# Patient Record
Sex: Male | Born: 1950 | Race: White | Hispanic: No | Marital: Single | Smoking: Current every day smoker
Health system: Southern US, Community
[De-identification: ages and names within clinical notes are randomized; demographics above are authoritative.]

## PROBLEM LIST (undated history)

## (undated) DIAGNOSIS — K219 Gastro-esophageal reflux disease without esophagitis: Secondary | ICD-10-CM

## (undated) DIAGNOSIS — I2510 Atherosclerotic heart disease of native coronary artery without angina pectoris: Secondary | ICD-10-CM

## (undated) DIAGNOSIS — E7800 Pure hypercholesterolemia, unspecified: Secondary | ICD-10-CM

## (undated) DIAGNOSIS — E119 Type 2 diabetes mellitus without complications: Secondary | ICD-10-CM

## (undated) DIAGNOSIS — I10 Essential (primary) hypertension: Secondary | ICD-10-CM

## (undated) DIAGNOSIS — I639 Cerebral infarction, unspecified: Secondary | ICD-10-CM

## (undated) DIAGNOSIS — T300 Burn of unspecified body region, unspecified degree: Secondary | ICD-10-CM

## (undated) HISTORY — PX: CORONARY ANGIOPLASTY: SHX604

---

## 2012-08-05 DIAGNOSIS — T300 Burn of unspecified body region, unspecified degree: Secondary | ICD-10-CM

## 2012-08-05 HISTORY — DX: Burn of unspecified body region, unspecified degree: T30.0

## 2013-11-25 ENCOUNTER — Emergency Department (HOSPITAL_COMMUNITY): Payer: Medicare Other

## 2013-11-25 ENCOUNTER — Inpatient Hospital Stay (HOSPITAL_COMMUNITY): Payer: Medicare Other

## 2013-11-25 ENCOUNTER — Encounter (HOSPITAL_COMMUNITY): Payer: Self-pay | Admitting: Emergency Medicine

## 2013-11-25 ENCOUNTER — Inpatient Hospital Stay (HOSPITAL_COMMUNITY)
Admission: EM | Admit: 2013-11-25 | Discharge: 2013-12-07 | DRG: 958 | Disposition: A | Discharge status: Skilled Nursing Facility | Payer: Medicare Other | Attending: General Surgery | Admitting: General Surgery

## 2013-11-25 DIAGNOSIS — S82209A Unspecified fracture of shaft of unspecified tibia, initial encounter for closed fracture: Secondary | ICD-10-CM | POA: Diagnosis present

## 2013-11-25 DIAGNOSIS — S32409A Unspecified fracture of unspecified acetabulum, initial encounter for closed fracture: Principal | ICD-10-CM | POA: Diagnosis present

## 2013-11-25 DIAGNOSIS — IMO0002 Reserved for concepts with insufficient information to code with codable children: Secondary | ICD-10-CM | POA: Diagnosis present

## 2013-11-25 DIAGNOSIS — J9819 Other pulmonary collapse: Secondary | ICD-10-CM | POA: Diagnosis present

## 2013-11-25 DIAGNOSIS — T792XXA Traumatic secondary and recurrent hemorrhage and seroma, initial encounter: Secondary | ICD-10-CM | POA: Diagnosis present

## 2013-11-25 DIAGNOSIS — S82891A Other fracture of right lower leg, initial encounter for closed fracture: Secondary | ICD-10-CM | POA: Diagnosis present

## 2013-11-25 DIAGNOSIS — S52599A Other fractures of lower end of unspecified radius, initial encounter for closed fracture: Secondary | ICD-10-CM | POA: Diagnosis present

## 2013-11-25 DIAGNOSIS — E785 Hyperlipidemia, unspecified: Secondary | ICD-10-CM | POA: Diagnosis present

## 2013-11-25 DIAGNOSIS — I10 Essential (primary) hypertension: Secondary | ICD-10-CM | POA: Diagnosis present

## 2013-11-25 DIAGNOSIS — S2220XA Unspecified fracture of sternum, initial encounter for closed fracture: Secondary | ICD-10-CM

## 2013-11-25 DIAGNOSIS — S82109A Unspecified fracture of upper end of unspecified tibia, initial encounter for closed fracture: Secondary | ICD-10-CM | POA: Diagnosis present

## 2013-11-25 DIAGNOSIS — Z8673 Personal history of transient ischemic attack (TIA), and cerebral infarction without residual deficits: Secondary | ICD-10-CM

## 2013-11-25 DIAGNOSIS — S82142A Displaced bicondylar fracture of left tibia, initial encounter for closed fracture: Secondary | ICD-10-CM | POA: Diagnosis present

## 2013-11-25 DIAGNOSIS — S8253XA Displaced fracture of medial malleolus of unspecified tibia, initial encounter for closed fracture: Secondary | ICD-10-CM | POA: Diagnosis present

## 2013-11-25 DIAGNOSIS — R509 Fever, unspecified: Secondary | ICD-10-CM | POA: Diagnosis not present

## 2013-11-25 DIAGNOSIS — E7800 Pure hypercholesterolemia, unspecified: Secondary | ICD-10-CM | POA: Diagnosis present

## 2013-11-25 DIAGNOSIS — E119 Type 2 diabetes mellitus without complications: Secondary | ICD-10-CM | POA: Diagnosis present

## 2013-11-25 DIAGNOSIS — S6990XA Unspecified injury of unspecified wrist, hand and finger(s), initial encounter: Secondary | ICD-10-CM | POA: Diagnosis present

## 2013-11-25 DIAGNOSIS — J811 Chronic pulmonary edema: Secondary | ICD-10-CM | POA: Diagnosis present

## 2013-11-25 DIAGNOSIS — S93409A Sprain of unspecified ligament of unspecified ankle, initial encounter: Secondary | ICD-10-CM | POA: Diagnosis present

## 2013-11-25 DIAGNOSIS — S27329A Contusion of lung, unspecified, initial encounter: Secondary | ICD-10-CM

## 2013-11-25 DIAGNOSIS — I2510 Atherosclerotic heart disease of native coronary artery without angina pectoris: Secondary | ICD-10-CM | POA: Diagnosis present

## 2013-11-25 DIAGNOSIS — S62109A Fracture of unspecified carpal bone, unspecified wrist, initial encounter for closed fracture: Secondary | ICD-10-CM | POA: Diagnosis present

## 2013-11-25 DIAGNOSIS — K560 Paralytic ileus: Secondary | ICD-10-CM | POA: Diagnosis present

## 2013-11-25 DIAGNOSIS — E875 Hyperkalemia: Secondary | ICD-10-CM | POA: Diagnosis not present

## 2013-11-25 DIAGNOSIS — S301XXA Contusion of abdominal wall, initial encounter: Secondary | ICD-10-CM | POA: Diagnosis present

## 2013-11-25 DIAGNOSIS — S62101A Fracture of unspecified carpal bone, right wrist, initial encounter for closed fracture: Secondary | ICD-10-CM | POA: Diagnosis present

## 2013-11-25 DIAGNOSIS — D62 Acute posthemorrhagic anemia: Secondary | ICD-10-CM | POA: Diagnosis present

## 2013-11-25 DIAGNOSIS — G8929 Other chronic pain: Secondary | ICD-10-CM | POA: Diagnosis present

## 2013-11-25 DIAGNOSIS — F17200 Nicotine dependence, unspecified, uncomplicated: Secondary | ICD-10-CM | POA: Diagnosis present

## 2013-11-25 DIAGNOSIS — S32402A Unspecified fracture of left acetabulum, initial encounter for closed fracture: Secondary | ICD-10-CM | POA: Diagnosis present

## 2013-11-25 HISTORY — DX: Burn of unspecified body region, unspecified degree: T30.0

## 2013-11-25 HISTORY — DX: Type 2 diabetes mellitus without complications: E11.9

## 2013-11-25 HISTORY — DX: Atherosclerotic heart disease of native coronary artery without angina pectoris: I25.10

## 2013-11-25 HISTORY — PX: ORIF DISTAL RADIUS FRACTURE: SUR927

## 2013-11-25 HISTORY — DX: Cerebral infarction, unspecified: I63.9

## 2013-11-25 HISTORY — DX: Pure hypercholesterolemia, unspecified: E78.00

## 2013-11-25 HISTORY — DX: Essential (primary) hypertension: I10

## 2013-11-25 HISTORY — DX: Gastro-esophageal reflux disease without esophagitis: K21.9

## 2013-11-25 LAB — CBC
HCT: 39.4 % (ref 39.0–52.0)
Hemoglobin: 12.5 g/dL — ABNORMAL LOW (ref 13.0–17.0)
MCH: 26 pg (ref 26.0–34.0)
MCHC: 31.7 g/dL (ref 30.0–36.0)
MCV: 81.9 fL (ref 78.0–100.0)
PLATELETS: 391 10*3/uL (ref 150–400)
RBC: 4.81 MIL/uL (ref 4.22–5.81)
RDW: 17.8 % — ABNORMAL HIGH (ref 11.5–15.5)
WBC: 35.3 10*3/uL — AB (ref 4.0–10.5)

## 2013-11-25 LAB — I-STAT CG4 LACTIC ACID, ED: Lactic Acid, Venous: 4.43 mmol/L — ABNORMAL HIGH (ref 0.5–2.2)

## 2013-11-25 LAB — COMPREHENSIVE METABOLIC PANEL
ALT: 33 U/L (ref 0–53)
AST: 36 U/L (ref 0–37)
Albumin: 3.4 g/dL — ABNORMAL LOW (ref 3.5–5.2)
Alkaline Phosphatase: 130 U/L — ABNORMAL HIGH (ref 39–117)
BILIRUBIN TOTAL: 0.2 mg/dL — AB (ref 0.3–1.2)
BUN: 15 mg/dL (ref 6–23)
CHLORIDE: 98 meq/L (ref 96–112)
CO2: 23 meq/L (ref 19–32)
CREATININE: 1 mg/dL (ref 0.50–1.35)
Calcium: 9 mg/dL (ref 8.4–10.5)
GFR calc Af Amer: >90 mL/min (ref >90)
GFR, EST NON AFRICAN AMERICAN: 78 mL/min — AB (ref >90)
Glucose, Bld: 244 mg/dL — ABNORMAL HIGH (ref 70–99)
Potassium: 4.3 mEq/L (ref 3.7–5.3)
Sodium: 136 mEq/L — ABNORMAL LOW (ref 137–147)
Total Protein: 6.7 g/dL (ref 6.0–8.3)

## 2013-11-25 LAB — ETHANOL: Alcohol, Ethyl (B): <11 mg/dL (ref 0–11)

## 2013-11-25 LAB — SAMPLE TO BLOOD BANK

## 2013-11-25 LAB — PROTIME-INR
INR: 0.96 (ref 0.00–1.49)
Prothrombin Time: 12.6 seconds (ref 11.6–15.2)

## 2013-11-25 LAB — GLUCOSE, CAPILLARY: Glucose-Capillary: 199 mg/dL — ABNORMAL HIGH (ref 70–99)

## 2013-11-25 MED ORDER — HYDROMORPHONE HCL PF 1 MG/ML IJ SOLN
1.0000 mg | Freq: Once | INTRAMUSCULAR | Status: AC
  Administered 2013-11-25: 1 mg via INTRAVENOUS
  Filled 2013-11-25: qty 1

## 2013-11-25 MED ORDER — IOHEXOL 350 MG/ML SOLN
100.0000 mL | Freq: Once | INTRAVENOUS | Status: AC | PRN
  Administered 2013-11-25: 100 mL via INTRAVENOUS

## 2013-11-25 MED ORDER — METOPROLOL TARTRATE 25 MG PO TABS
25.0000 mg | ORAL_TABLET | Freq: Two times a day (BID) | ORAL | Status: DC
  Administered 2013-11-25 – 2013-11-29 (×8): 25 mg via ORAL
  Filled 2013-11-25 (×9): qty 1

## 2013-11-25 MED ORDER — DIPHENHYDRAMINE HCL 50 MG/ML IJ SOLN
12.5000 mg | Freq: Four times a day (QID) | INTRAMUSCULAR | Status: DC | PRN
Start: 2013-11-25 — End: 2013-11-26

## 2013-11-25 MED ORDER — DOCUSATE SODIUM 100 MG PO CAPS
100.0000 mg | ORAL_CAPSULE | Freq: Two times a day (BID) | ORAL | Status: DC
  Administered 2013-11-25 – 2013-11-28 (×6): 100 mg via ORAL
  Filled 2013-11-25 (×9): qty 1

## 2013-11-25 MED ORDER — HYDROMORPHONE HCL PF 1 MG/ML IJ SOLN
1.0000 mg | Freq: Once | INTRAMUSCULAR | Status: AC
  Administered 2013-11-25: 1 mg via INTRAVENOUS

## 2013-11-25 MED ORDER — SODIUM CHLORIDE 0.9 % IJ SOLN: 9.0000 mL | INTRAMUSCULAR | Status: DC | PRN

## 2013-11-25 MED ORDER — BISACODYL 10 MG RE SUPP: 10.0000 mg | Freq: Every day | RECTAL | Status: DC | PRN

## 2013-11-25 MED ORDER — ONDANSETRON HCL 4 MG/2ML IJ SOLN
4.0000 mg | Freq: Once | INTRAMUSCULAR | Status: AC
  Administered 2013-11-25: 4 mg via INTRAVENOUS
  Filled 2013-11-25: qty 2

## 2013-11-25 MED ORDER — HYDROMORPHONE HCL PF 1 MG/ML IJ SOLN
0.5000 mg | Freq: Once | INTRAMUSCULAR | Status: AC
  Administered 2013-11-25: 0.5 mg via INTRAVENOUS
  Filled 2013-11-25: qty 1

## 2013-11-25 MED ORDER — ENALAPRIL MALEATE 20 MG PO TABS
20.0000 mg | ORAL_TABLET | Freq: Every day | ORAL | Status: DC
  Administered 2013-11-26 – 2013-11-28 (×3): 20 mg via ORAL
  Filled 2013-11-25 (×4): qty 1

## 2013-11-25 MED ORDER — HYDROMORPHONE HCL PF 1 MG/ML IJ SOLN
INTRAMUSCULAR | Status: AC
  Filled 2013-11-25: qty 1

## 2013-11-25 MED ORDER — MORPHINE SULFATE (PF) 1 MG/ML IV SOLN
INTRAVENOUS | Status: DC
  Administered 2013-11-25: 1.5 mg via INTRAVENOUS
  Administered 2013-11-26: 6.98 mg via INTRAVENOUS
  Administered 2013-11-26: 4.5 mg via INTRAVENOUS
  Administered 2013-11-26: 15.39 mg via INTRAVENOUS
  Administered 2013-11-26: 10:00:00 via INTRAVENOUS
  Administered 2013-11-26: 29.78 mg via INTRAVENOUS
  Administered 2013-11-26: 30.83 mg via INTRAVENOUS
  Administered 2013-11-26 (×2): via INTRAVENOUS
  Filled 2013-11-25 (×4): qty 25

## 2013-11-25 MED ORDER — SODIUM CHLORIDE 0.9 % IV BOLUS (SEPSIS)
1000.0000 mL | Freq: Once | INTRAVENOUS | Status: AC
  Administered 2013-11-25: 1000 mL via INTRAVENOUS

## 2013-11-25 MED ORDER — PANTOPRAZOLE SODIUM 40 MG PO TBEC
40.0000 mg | DELAYED_RELEASE_TABLET | Freq: Every day | ORAL | Status: DC
  Administered 2013-11-27 – 2013-12-07 (×7): 40 mg via ORAL
  Filled 2013-11-25 (×8): qty 1

## 2013-11-25 MED ORDER — ONDANSETRON HCL 4 MG/2ML IJ SOLN: 4.0000 mg | Freq: Four times a day (QID) | INTRAMUSCULAR | Status: DC | PRN

## 2013-11-25 MED ORDER — PANTOPRAZOLE SODIUM 40 MG IV SOLR
40.0000 mg | Freq: Every day | INTRAVENOUS | Status: DC
  Administered 2013-11-26 – 2013-12-02 (×4): 40 mg via INTRAVENOUS
  Filled 2013-11-25 (×8): qty 40

## 2013-11-25 MED ORDER — POTASSIUM CHLORIDE IN NACL 20-0.9 MEQ/L-% IV SOLN
INTRAVENOUS | Status: DC
  Filled 2013-11-25 (×4): qty 1000

## 2013-11-25 MED ORDER — INSULIN ASPART 100 UNIT/ML ~~LOC~~ SOLN
0.0000 [IU] | SUBCUTANEOUS | Status: DC
  Administered 2013-11-25: 3 [IU] via SUBCUTANEOUS
  Administered 2013-11-26 (×2): 2 [IU] via SUBCUTANEOUS

## 2013-11-25 MED ORDER — DIPHENHYDRAMINE HCL 12.5 MG/5ML PO ELIX: 12.5000 mg | ORAL_SOLUTION | Freq: Four times a day (QID) | ORAL | Status: DC | PRN

## 2013-11-25 MED ORDER — ENOXAPARIN SODIUM 40 MG/0.4ML ~~LOC~~ SOLN
40.0000 mg | SUBCUTANEOUS | Status: DC
Start: 2013-11-26 — End: 2013-11-26
  Filled 2013-11-25 (×2): qty 0.4

## 2013-11-25 MED ORDER — DILTIAZEM HCL ER 120 MG PO CP24
120.0000 mg | ORAL_CAPSULE | Freq: Every day | ORAL | Status: DC
  Administered 2013-11-26 – 2013-11-29 (×4): 120 mg via ORAL
  Filled 2013-11-25 (×4): qty 1

## 2013-11-25 MED ORDER — NALOXONE HCL 0.4 MG/ML IJ SOLN: 0.4000 mg | INTRAMUSCULAR | Status: DC | PRN

## 2013-11-25 MED ORDER — FENTANYL CITRATE 0.05 MG/ML IJ SOLN
25.0000 ug | Freq: Once | INTRAMUSCULAR | Status: AC
  Administered 2013-11-25: 25 ug via INTRAVENOUS
  Filled 2013-11-25: qty 2

## 2013-11-25 NOTE — ED Notes (Signed)
EDP at bedside to reduce wrist.

## 2013-11-25 NOTE — ED Notes (Addendum)
Pt has abrasion to left forearm, right upper chest, right lower chest, left knee. Deformity to left ankle, swelling to left knee, swelling to right ankle and right wrist. Abrasion to left hip.

## 2013-11-25 NOTE — Progress Notes (Signed)
Orthopedic Tech Progress Note Patient Details:  [name] [birth year] [id number]  Musculoskeletal Traction Type of Traction: Bucks Skin Traction Traction Location: LLE Traction Weight: 5 lbs    Jennye Moccasinnthony Craig Ashaun Gaughan 11/25/2013, 10:59 PM

## 2013-11-25 NOTE — ED Notes (Signed)
Trauma MD at bedside. POC discussed with ortho and trauma. Ortho tech to be paged.

## 2013-11-25 NOTE — ED Notes (Signed)
Belongings given to family; ortho at bedside.

## 2013-11-25 NOTE — ED Notes (Signed)
Dr. Gwendolyn GrantWalden reporting activate Level 2 Trauma.
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
## 2013-11-25 NOTE — ED Notes (Signed)
RN called CT scanner; Pt reported to be "fine" at this time.

## 2013-11-25 NOTE — ED Notes (Signed)
Pt returned from xray alert. Pain improved 8/10.
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
## 2013-11-25 NOTE — Progress Notes (Signed)
Orthopedic Tech Progress Note Patient Details:  [name] 08-Feb-1951 [id number] Responded to trauma page for patient. Patient ID: [name], male   DOB: 08-Feb-1951, 63 y.o.   MRN: [id number]   [name] 11/25/2013, 4:59 PM

## 2013-11-25 NOTE — Progress Notes (Signed)
Orthopedic Tech Progress Note Patient Details:  [name] November 10, 1950 [id number]  Ortho Devices Type of Ortho Device: Ace wrap;Post (short leg) splint;Knee Immobilizer Ortho Device/Splint Location: short leg splint RLE- knee immobilizer LLE Ortho Device/Splint Interventions: Ordered;Application   [name] 11/25/2013, 8:56 PM

## 2013-11-25 NOTE — Progress Notes (Signed)
Orthopedic Tech Progress Note Patient Details:  [name] 10/10/Hoy Morn1952 [id number]  Ortho Devices Type of Ortho Device: Ace wrap;Sugartong splint Ortho Device/Splint Location: RUE Ortho Device/Splint Interventions: Ordered;Application   Jennye MoccasinAnthony Craig Avory Mimbs 11/25/2013, 11:01 PM

## 2013-11-25 NOTE — ED Notes (Signed)
CALL DR. Merlyn AlbertALUCIO at [phone] with ortho questions.
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
## 2013-11-25 NOTE — ED Notes (Signed)
Pt remains in CT/XRAY 

## 2013-11-25 NOTE — ED Notes (Signed)
Pt is A&Ox3; complaining of back pain and knee pain. Pt's family at bedside.

## 2013-11-25 NOTE — ED Notes (Signed)
Patient transported to X-ray 
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
## 2013-11-25 NOTE — ED Notes (Signed)
Floor, Selena BattenKim, notified per Domingo MendStirling MD that right arm reduction was not perform.

## 2013-11-25 NOTE — ED Notes (Signed)
EDP able to reduce wrist; ortho tech splinting. Pt to go upstairs.
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
## 2013-11-25 NOTE — ED Notes (Signed)
Ortho tech paged  
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
## 2013-11-25 NOTE — ED Notes (Signed)
Patient transported to CT 
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
## 2013-11-26 ENCOUNTER — Inpatient Hospital Stay (HOSPITAL_COMMUNITY): Payer: Medicare Other

## 2013-11-26 ENCOUNTER — Encounter (HOSPITAL_COMMUNITY): Admission: EM | Disposition: A | Discharge status: Skilled Nursing Facility | Payer: Self-pay | Source: Home / Self Care

## 2013-11-26 DIAGNOSIS — S62101A Fracture of unspecified carpal bone, right wrist, initial encounter for closed fracture: Secondary | ICD-10-CM | POA: Diagnosis present

## 2013-11-26 DIAGNOSIS — S82142A Displaced bicondylar fracture of left tibia, initial encounter for closed fracture: Secondary | ICD-10-CM | POA: Diagnosis present

## 2013-11-26 DIAGNOSIS — E119 Type 2 diabetes mellitus without complications: Secondary | ICD-10-CM | POA: Insufficient documentation

## 2013-11-26 DIAGNOSIS — E7800 Pure hypercholesterolemia, unspecified: Secondary | ICD-10-CM | POA: Insufficient documentation

## 2013-11-26 DIAGNOSIS — I10 Essential (primary) hypertension: Secondary | ICD-10-CM | POA: Insufficient documentation

## 2013-11-26 DIAGNOSIS — S32402A Unspecified fracture of left acetabulum, initial encounter for closed fracture: Secondary | ICD-10-CM | POA: Diagnosis present

## 2013-11-26 DIAGNOSIS — S301XXA Contusion of abdominal wall, initial encounter: Secondary | ICD-10-CM | POA: Diagnosis present

## 2013-11-26 DIAGNOSIS — E875 Hyperkalemia: Secondary | ICD-10-CM | POA: Diagnosis not present

## 2013-11-26 DIAGNOSIS — S2220XA Unspecified fracture of sternum, initial encounter for closed fracture: Secondary | ICD-10-CM | POA: Diagnosis present

## 2013-11-26 DIAGNOSIS — S27329A Contusion of lung, unspecified, initial encounter: Secondary | ICD-10-CM | POA: Diagnosis present

## 2013-11-26 DIAGNOSIS — D62 Acute posthemorrhagic anemia: Secondary | ICD-10-CM | POA: Diagnosis present

## 2013-11-26 DIAGNOSIS — S82891A Other fracture of right lower leg, initial encounter for closed fracture: Secondary | ICD-10-CM | POA: Diagnosis present

## 2013-11-26 LAB — GLUCOSE, CAPILLARY
GLUCOSE-CAPILLARY: 132 mg/dL — AB (ref 70–99)
GLUCOSE-CAPILLARY: 124 mg/dL — AB (ref 70–99)
GLUCOSE-CAPILLARY: 129 mg/dL — AB (ref 70–99)
Glucose-Capillary: 127 mg/dL — ABNORMAL HIGH (ref 70–99)
Glucose-Capillary: 131 mg/dL — ABNORMAL HIGH (ref 70–99)
Glucose-Capillary: 150 mg/dL — ABNORMAL HIGH (ref 70–99)

## 2013-11-26 LAB — COMPREHENSIVE METABOLIC PANEL WITH GFR
ALT: 29 U/L (ref 0–53)
AST: 42 U/L — ABNORMAL HIGH (ref 0–37)
Albumin: 3 g/dL — ABNORMAL LOW (ref 3.5–5.2)
Alkaline Phosphatase: 112 U/L (ref 39–117)
BUN: 19 mg/dL (ref 6–23)
CO2: 23 meq/L (ref 19–32)
Calcium: 8.4 mg/dL (ref 8.4–10.5)
Chloride: 103 meq/L (ref 96–112)
Creatinine, Ser: 1.07 mg/dL (ref 0.50–1.35)
GFR calc Af Amer: 83 mL/min — ABNORMAL LOW
GFR calc non Af Amer: 72 mL/min — ABNORMAL LOW
Glucose, Bld: 138 mg/dL — ABNORMAL HIGH (ref 70–99)
Potassium: 5.6 meq/L — ABNORMAL HIGH (ref 3.7–5.3)
Sodium: 139 meq/L (ref 137–147)
Total Bilirubin: 0.4 mg/dL (ref 0.3–1.2)
Total Protein: 6.3 g/dL (ref 6.0–8.3)

## 2013-11-26 LAB — SURGICAL PCR SCREEN
MRSA, PCR: NEGATIVE
STAPHYLOCOCCUS AUREUS: NEGATIVE

## 2013-11-26 LAB — RAPID URINE DRUG SCREEN, HOSP PERFORMED
Amphetamines: NOT DETECTED
BARBITURATES: NOT DETECTED
Benzodiazepines: POSITIVE — AB
COCAINE: NOT DETECTED
Opiates: POSITIVE — AB
TETRAHYDROCANNABINOL: NOT DETECTED

## 2013-11-26 LAB — CBC
HCT: 35.9 % — ABNORMAL LOW (ref 39.0–52.0)
Hemoglobin: 11 g/dL — ABNORMAL LOW (ref 13.0–17.0)
MCH: 25.7 pg — ABNORMAL LOW (ref 26.0–34.0)
MCHC: 30.6 g/dL (ref 30.0–36.0)
MCV: 83.9 fL (ref 78.0–100.0)
Platelets: 323 10*3/uL (ref 150–400)
RBC: 4.28 MIL/uL (ref 4.22–5.81)
RDW: 18.2 % — ABNORMAL HIGH (ref 11.5–15.5)
WBC: 13.7 10*3/uL — ABNORMAL HIGH (ref 4.0–10.5)

## 2013-11-26 LAB — PROTIME-INR
INR: 1.02 (ref 0.00–1.49)
Prothrombin Time: 13.2 seconds (ref 11.6–15.2)

## 2013-11-26 LAB — LACTIC ACID, PLASMA: Lactic Acid, Venous: 1.7 mmol/L (ref 0.5–2.2)

## 2013-11-26 LAB — APTT: aPTT: 30 seconds (ref 24–37)

## 2013-11-26 SURGERY — OPEN REDUCTION INTERNAL FIXATION (ORIF) DISTAL RADIUS FRACTURE
Anesthesia: General | Laterality: Right

## 2013-11-26 MED ORDER — ENOXAPARIN SODIUM 30 MG/0.3ML ~~LOC~~ SOLN
30.0000 mg | Freq: Two times a day (BID) | SUBCUTANEOUS | Status: DC
  Administered 2013-11-26 – 2013-11-28 (×4): 30 mg via SUBCUTANEOUS
  Filled 2013-11-26 (×8): qty 0.3

## 2013-11-26 MED ORDER — OXYCODONE HCL 5 MG PO TABS
5.0000 mg | ORAL_TABLET | ORAL | Status: DC | PRN
  Administered 2013-11-26 (×2): 10 mg via ORAL
  Administered 2013-11-27 (×4): 15 mg via ORAL
  Administered 2013-11-28: 10 mg via ORAL
  Administered 2013-11-28 – 2013-11-29 (×2): 15 mg via ORAL
  Filled 2013-11-26 (×4): qty 3
  Filled 2013-11-26: qty 2
  Filled 2013-11-26 (×3): qty 3
  Filled 2013-11-26 (×2): qty 2

## 2013-11-26 MED ORDER — ONDANSETRON HCL 4 MG/2ML IJ SOLN
4.0000 mg | INTRAMUSCULAR | Status: DC | PRN
  Administered 2013-11-27: 4 mg via INTRAVENOUS
  Filled 2013-11-26: qty 2

## 2013-11-26 MED ORDER — MORPHINE SULFATE 4 MG/ML IJ SOLN
4.0000 mg | INTRAMUSCULAR | Status: DC | PRN
  Administered 2013-11-26 – 2013-11-27 (×2): 4 mg via INTRAVENOUS
  Filled 2013-11-26 (×2): qty 1

## 2013-11-26 MED ORDER — DIAZEPAM 5 MG PO TABS
10.0000 mg | ORAL_TABLET | Freq: Three times a day (TID) | ORAL | Status: DC | PRN
  Administered 2013-11-26 – 2013-11-27 (×2): 10 mg via ORAL
  Filled 2013-11-26 (×2): qty 2

## 2013-11-26 MED ORDER — POLYETHYLENE GLYCOL 3350 17 G PO PACK
17.0000 g | PACK | Freq: Every day | ORAL | Status: DC
  Administered 2013-11-27 – 2013-11-28 (×2): 17 g via ORAL
  Filled 2013-11-26 (×4): qty 1

## 2013-11-26 MED ORDER — ATORVASTATIN CALCIUM 80 MG PO TABS
80.0000 mg | ORAL_TABLET | Freq: Every day | ORAL | Status: DC
  Administered 2013-11-26 – 2013-11-28 (×3): 80 mg via ORAL
  Filled 2013-11-26 (×5): qty 1

## 2013-11-26 MED ORDER — INSULIN ASPART 100 UNIT/ML ~~LOC~~ SOLN
0.0000 [IU] | Freq: Three times a day (TID) | SUBCUTANEOUS | Status: DC
  Administered 2013-11-26 – 2013-11-27 (×3): 2 [IU] via SUBCUTANEOUS
  Administered 2013-11-27: 3 [IU] via SUBCUTANEOUS
  Administered 2013-11-28: 2 [IU] via SUBCUTANEOUS
  Administered 2013-11-28: 3 [IU] via SUBCUTANEOUS
  Administered 2013-11-28 – 2013-11-29 (×2): 2 [IU] via SUBCUTANEOUS

## 2013-11-26 MED ORDER — SODIUM CHLORIDE 0.45 % IV SOLN
INTRAVENOUS | Status: DC
  Administered 2013-11-26: 10:00:00 via INTRAVENOUS

## 2013-11-26 MED ORDER — LIDOCAINE HCL (PF) 1 % IJ SOLN
5.0000 mL | Freq: Once | INTRAMUSCULAR | Status: AC
  Administered 2013-11-26: 5 mL
  Filled 2013-11-26: qty 5
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
## 2013-11-26 NOTE — Progress Notes (Signed)
Orthopedic Tech Progress Note Patient Details:  [name] [birth year] [id number]  Musculoskeletal Traction Type of Traction: Skeletal (Balanced Suspension) Traction Location: LLE Traction Weight: 20 lbs    Mickie BailJennifer Carol Cammer 11/26/2013, 2:32 PM
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
## 2013-11-26 NOTE — Progress Notes (Signed)
Will check flex ex. Dr. Carola FrostHandy to see and discuss timing of surgery. I spoke with his family. Patient examined and I agree with the assessment and plan  Violeta GelinasBurke Katrine Radich, MD, MPH, FACS Trauma: [phone] General Surgery: [phone]  11/26/2013 10:22 AM
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
## 2013-11-26 NOTE — Progress Notes (Signed)
Subjective: Patient states right wrist feels much better this morning.  Has noted normalization of sensation in the digits overnight.  No complaints.   Objective: Vital signs in last 24 hours: Temp:  [97.5 F (36.4 C)-98.2 F (36.8 C)] 97.6 F (36.4 C) (04/24 0546) Pulse Rate:  [104-120] 106 (04/24 0546) Resp:  [16-23] 20 (04/24 0546) BP: (125-198)/(70-99) 125/71 mmHg (04/24 0546) SpO2:  [93 %-99 %] 95 % (04/24 0546) Weight:  [102.059 kg (225 lb)] 102.059 kg (225 lb) (04/23 1635)  Intake/Output from previous day: 04/23 0701 - 04/24 0700 In: 2240 [P.O.:240; I.V.:2000] Out: 200 [Urine:200] Intake/Output this shift:     Recent Labs  11/25/13 1633 11/26/13 0358  HGB 12.5* 11.0*    Recent Labs  11/25/13 1633 11/26/13 0358  WBC 35.3* 13.7*  RBC 4.81 4.28  HCT 39.4 35.9*  PLT 391 323    Recent Labs  11/25/13 1633 11/26/13 0358  NA 136* 139  K 4.3 5.6*  CL 98 103  CO2 23 23  BUN 15 19  CREATININE 1.00 1.07  GLUCOSE 244* 138*  CALCIUM 9.0 8.4    Recent Labs  11/25/13 1633 11/26/13 0358  INR 0.96 1.02    intact sensation and capillary refill all digits.  +epl/fpl/io.  splint c/d/i  Assessment/Plan: Right distal radius fracture.  Plan ORIF right distal radius and carpal tunnel release possibly this afternoon or early next week.  Given improvement in sensation from last night, may be okay to wait a few days.   Tami RibasKevin R Chosen Geske 11/26/2013, 7:53 AM
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
## 2013-11-26 NOTE — Progress Notes (Signed)
UR completed.  Traves Majchrzak, RN BSN [facility] CCM Trauma/Neuro ICU Case Manager [phone]  

## 2013-11-27 DIAGNOSIS — S301XXA Contusion of abdominal wall, initial encounter: Secondary | ICD-10-CM

## 2013-11-27 LAB — BASIC METABOLIC PANEL
BUN: 22 mg/dL (ref 6–23)
CO2: 23 mEq/L (ref 19–32)
Calcium: 8.5 mg/dL (ref 8.4–10.5)
Chloride: 101 mEq/L (ref 96–112)
Creatinine, Ser: 0.94 mg/dL (ref 0.50–1.35)
GFR calc Af Amer: >90 mL/min (ref >90)
GFR, EST NON AFRICAN AMERICAN: 87 mL/min — AB (ref >90)
Glucose, Bld: 140 mg/dL — ABNORMAL HIGH (ref 70–99)
Potassium: 5.2 mEq/L (ref 3.7–5.3)
SODIUM: 137 meq/L (ref 137–147)

## 2013-11-27 LAB — CBC
HCT: 31.9 % — ABNORMAL LOW (ref 39.0–52.0)
Hemoglobin: 9.8 g/dL — ABNORMAL LOW (ref 13.0–17.0)
MCH: 25.8 pg — ABNORMAL LOW (ref 26.0–34.0)
MCHC: 30.7 g/dL (ref 30.0–36.0)
MCV: 83.9 fL (ref 78.0–100.0)
Platelets: 244 10*3/uL (ref 150–400)
RBC: 3.8 MIL/uL — ABNORMAL LOW (ref 4.22–5.81)
RDW: 18 % — AB (ref 11.5–15.5)
WBC: 13.3 10*3/uL — ABNORMAL HIGH (ref 4.0–10.5)

## 2013-11-27 LAB — GLUCOSE, CAPILLARY
GLUCOSE-CAPILLARY: 135 mg/dL — AB (ref 70–99)
Glucose-Capillary: 147 mg/dL — ABNORMAL HIGH (ref 70–99)
Glucose-Capillary: 154 mg/dL — ABNORMAL HIGH (ref 70–99)
Glucose-Capillary: 166 mg/dL — ABNORMAL HIGH (ref 70–99)

## 2013-11-27 MED ORDER — HYDROMORPHONE HCL PF 1 MG/ML IJ SOLN
1.0000 mg | INTRAMUSCULAR | Status: DC | PRN
  Administered 2013-11-27 – 2013-11-29 (×10): 1 mg via INTRAVENOUS
  Filled 2013-11-27 (×11): qty 1

## 2013-11-27 MED ORDER — PROMETHAZINE HCL 25 MG/ML IJ SOLN
12.5000 mg | Freq: Four times a day (QID) | INTRAMUSCULAR | Status: DC | PRN
  Administered 2013-11-27 – 2013-11-28 (×3): 25 mg via INTRAVENOUS
  Filled 2013-11-27 (×3): qty 1

## 2013-11-27 NOTE — Progress Notes (Signed)
Orthopedic Tech Progress Note Patient Details:  [name] Sep 07, 1950 [id number] Spoke with nursing; patient is already wearing Aspen collar. No action needed from Ortho tech at this time.  Patient ID: [name], male   DOB: Sep 07, 1950, 63 y.o.   MRN: [id number]   [name] 11/27/2013, 4:12 PM

## 2013-11-27 NOTE — Progress Notes (Addendum)
Subjective:   Procedure(s) (LRB): OPEN REDUCTION INTERNAL FIXATION (ORIF) DISTAL RADIAL FRACTURE (Right) Patient reports pain as 6 on 0-10 scale.  Pain meds recently changed due to poorly controlled pain.  Patient still reports moderate amount of pain but is better than previous to med change.    Objective: Vital signs in last 24 hours: Temp:  [97.1 F (36.2 C)-99 F (37.2 C)] 99 F (37.2 C) (04/25 0617) Pulse Rate:  [102-111] 102 (04/25 0617) Resp:  [18-22] 18 (04/25 0617) BP: (121-130)/(58-109) 130/76 mmHg (04/25 0617) SpO2:  [80 %-97 %] 90 % (04/25 0617)  Intake/Output from previous day: 04/24 0701 - 04/25 0700 In: -  Out: 600 [Urine:600] Intake/Output this shift:     Recent Labs  11/25/13 1633 11/26/13 0358 11/27/13 0739  HGB 12.5* 11.0* 9.8*    Recent Labs  11/26/13 0358 11/27/13 0739  WBC 13.7* 13.3*  RBC 4.28 3.80*  HCT 35.9* 31.9*  PLT 323 244    Recent Labs  11/26/13 0358 11/27/13 0739  NA 139 137  K 5.6* 5.2  CL 103 101  CO2 23 23  BUN 19 22  CREATININE 1.07 0.94  GLUCOSE 138* 140*  CALCIUM 8.4 8.5    Recent Labs  11/25/13 1633 11/26/13 0358  INR 0.96 1.02    Neurologically intact Neurovascular intact Sensation intact distally Intact pulses distally Compartment soft  RUE: Posterior long arm splint in place Good motor and sensory function  LUE: Dressing to hand in place Good motor and sensory function  RLE: Short leg splint in place No hip and knee pathology noted Motor and sensory function intact   LLE: [place] traction still in place Leg dressing in place Sensory and motor function intact   Assessment/Plan:   Procedure(s) (LRB): OPEN REDUCTION INTERNAL FIXATION (ORIF) DISTAL RADIAL FRACTURE (Right)  Plan:  RUE: Plan per Dr. Merlyn LotKuzma  RLE: NWB x 6 weeks  LLE: Continue skeletal traction for L acetabular fxr Bedrest Ice prn for pain and swelling Turn every 2 hours Touchdown weight bearing Posterior hip  precautions Unrestricted ROM L knee  KVO order in place OR on Monday to address multiple orthopedic injuries Please make patient NPO after midnight on Sunday Please hold Lovenox Sunday night   M. Odelia GageLindsey Anton 11/27/2013, 10:03 AM
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
## 2013-11-27 NOTE — Progress Notes (Signed)
I have seen and examined the patient and agree with the assessment and plans. Adjusting pain meds  Joevon Holliman A. Magnus IvanBlackman  MD, FACS

## 2013-11-28 ENCOUNTER — Inpatient Hospital Stay (HOSPITAL_COMMUNITY): Payer: Medicare Other

## 2013-11-28 DIAGNOSIS — D72829 Elevated white blood cell count, unspecified: Secondary | ICD-10-CM

## 2013-11-28 LAB — URINALYSIS, ROUTINE W REFLEX MICROSCOPIC
GLUCOSE, UA: NEGATIVE mg/dL
Hgb urine dipstick: NEGATIVE
Ketones, ur: NEGATIVE mg/dL
Leukocytes, UA: NEGATIVE
Nitrite: NEGATIVE
Protein, ur: 30 mg/dL — AB
SPECIFIC GRAVITY, URINE: 1.029 (ref 1.005–1.030)
Urobilinogen, UA: 1 mg/dL (ref 0.0–1.0)
pH: 5.5 (ref 5.0–8.0)

## 2013-11-28 LAB — GLUCOSE, CAPILLARY
GLUCOSE-CAPILLARY: 149 mg/dL — AB (ref 70–99)
GLUCOSE-CAPILLARY: 145 mg/dL — AB (ref 70–99)
Glucose-Capillary: 161 mg/dL — ABNORMAL HIGH (ref 70–99)
Glucose-Capillary: 139 mg/dL — ABNORMAL HIGH (ref 70–99)

## 2013-11-28 LAB — URINE MICROSCOPIC-ADD ON

## 2013-11-28 LAB — CBC
HCT: 32.5 % — ABNORMAL LOW (ref 39.0–52.0)
HEMOGLOBIN: 10.1 g/dL — AB (ref 13.0–17.0)
MCH: 26.1 pg (ref 26.0–34.0)
MCHC: 31.1 g/dL (ref 30.0–36.0)
MCV: 84 fL (ref 78.0–100.0)
PLATELETS: 258 10*3/uL (ref 150–400)
RBC: 3.87 MIL/uL — ABNORMAL LOW (ref 4.22–5.81)
RDW: 17.7 % — ABNORMAL HIGH (ref 11.5–15.5)
WBC: 16.4 10*3/uL — ABNORMAL HIGH (ref 4.0–10.5)
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
## 2013-11-28 NOTE — Progress Notes (Signed)
No neck pain, no post midline tenderness, no pain with AROM - neck cleared and collar removed. OR with ortho tomorrow. Patient examined and I agree with the assessment and plan  Violeta GelinasBurke Datrell Dunton, MD, MPH, FACS Trauma: [phone] General Surgery: [phone]  11/28/2013 10:58 AM

## 2013-11-29 ENCOUNTER — Inpatient Hospital Stay (HOSPITAL_COMMUNITY): Payer: Medicare Other

## 2013-11-29 ENCOUNTER — Encounter: Payer: Self-pay | Admitting: Oncology

## 2013-11-29 ENCOUNTER — Encounter (HOSPITAL_COMMUNITY): Payer: Self-pay | Admitting: General Practice

## 2013-11-29 ENCOUNTER — Encounter (HOSPITAL_COMMUNITY): Payer: Medicare Other | Admitting: Anesthesiology

## 2013-11-29 ENCOUNTER — Encounter (HOSPITAL_COMMUNITY): Admission: EM | Disposition: A | Discharge status: Skilled Nursing Facility | Payer: Self-pay | Source: Home / Self Care

## 2013-11-29 ENCOUNTER — Inpatient Hospital Stay (HOSPITAL_COMMUNITY): Payer: Medicare Other | Admitting: Anesthesiology

## 2013-11-29 ENCOUNTER — Telehealth: Payer: Self-pay | Admitting: Oncology

## 2013-11-29 HISTORY — PX: ORIF ACETABULAR FRACTURE: SHX5029

## 2013-11-29 HISTORY — PX: ORIF TIBIA PLATEAU: SHX2132

## 2013-11-29 HISTORY — PX: OPEN REDUCTION INTERNAL FIXATION (ORIF) DISTAL RADIAL FRACTURE: SHX5989

## 2013-11-29 HISTORY — PX: ORIF ANKLE FRACTURE: SHX5408

## 2013-11-29 LAB — POCT I-STAT EG7
ACID-BASE EXCESS: 1 mmol/L (ref 0.0–2.0)
Acid-Base Excess: 1 mmol/L (ref 0.0–2.0)
Acid-Base Excess: 1 mmol/L (ref 0.0–2.0)
Bicarbonate: 29.9 meq/L — ABNORMAL HIGH (ref 20.0–24.0)
Bicarbonate: 27.8 meq/L — ABNORMAL HIGH (ref 20.0–24.0)
Bicarbonate: 27.3 meq/L — ABNORMAL HIGH (ref 20.0–24.0)
CALCIUM ION: 1.16 mmol/L (ref 1.13–1.30)
CALCIUM ION: 1.12 mmol/L — AB (ref 1.13–1.30)
CALCIUM ION: 1.13 mmol/L (ref 1.13–1.30)
HCT: 30 % — ABNORMAL LOW (ref 39.0–52.0)
HCT: 26 % — ABNORMAL LOW (ref 39.0–52.0)
HEMATOCRIT: 24 % — AB (ref 39.0–52.0)
HEMOGLOBIN: 8.2 g/dL — AB (ref 13.0–17.0)
Hemoglobin: 10.2 g/dL — ABNORMAL LOW (ref 13.0–17.0)
Hemoglobin: 8.8 g/dL — ABNORMAL LOW (ref 13.0–17.0)
O2 SAT: 86 %
O2 SAT: 77 %
O2 Saturation: 74 %
PCO2 VEN: 50.1 mmHg — AB (ref 45.0–50.0)
PH VEN: 7.334 — AB (ref 7.250–7.300)
PO2 VEN: 60 mmHg — AB (ref 30.0–45.0)
PO2 VEN: 40 mmHg (ref 30.0–45.0)
POTASSIUM: 4.9 meq/L (ref 3.7–5.3)
Patient temperature: 35.7
Patient temperature: 35.2
Potassium: 5.3 meq/L (ref 3.7–5.3)
Potassium: 5.2 meq/L (ref 3.7–5.3)
Sodium: 132 meq/L — ABNORMAL LOW (ref 137–147)
Sodium: 132 meq/L — ABNORMAL LOW (ref 137–147)
Sodium: 133 meq/L — ABNORMAL LOW (ref 137–147)
TCO2: 32 mmol/L (ref 0–100)
TCO2: 29 mmol/L (ref 0–100)
TCO2: 29 mmol/L (ref 0–100)
pCO2, Ven: 72.2 mmHg (ref 45.0–50.0)
pCO2, Ven: 49.2 mmHg (ref 45.0–50.0)
pH, Ven: 7.218 — ABNORMAL LOW (ref 7.250–7.300)
pH, Ven: 7.351 — ABNORMAL HIGH (ref 7.250–7.300)
pO2, Ven: 38 mmHg (ref 30.0–45.0)

## 2013-11-29 LAB — CBC WITH DIFFERENTIAL/PLATELET
BASOS ABS: 0 10*3/uL (ref 0.0–0.1)
BASOS PCT: 0 % (ref 0–1)
EOS PCT: 0 % (ref 0–5)
Eosinophils Absolute: 0 10*3/uL (ref 0.0–0.7)
HEMATOCRIT: 31.8 % — AB (ref 39.0–52.0)
Hemoglobin: 10.2 g/dL — ABNORMAL LOW (ref 13.0–17.0)
LYMPHS PCT: 11 % — AB (ref 12–46)
Lymphs Abs: 1.4 10*3/uL (ref 0.7–4.0)
MCH: 26.8 pg (ref 26.0–34.0)
MCHC: 32.1 g/dL (ref 30.0–36.0)
MCV: 83.5 fL (ref 78.0–100.0)
Monocytes Absolute: 0.8 10*3/uL (ref 0.1–1.0)
Monocytes Relative: 7 % (ref 3–12)
Neutro Abs: 10.1 10*3/uL — ABNORMAL HIGH (ref 1.7–7.7)
Neutrophils Relative %: 82 % — ABNORMAL HIGH (ref 43–77)
Platelets: 239 10*3/uL (ref 150–400)
RBC: 3.81 MIL/uL — ABNORMAL LOW (ref 4.22–5.81)
RDW: 16.2 % — ABNORMAL HIGH (ref 11.5–15.5)
WBC: 12.3 10*3/uL — ABNORMAL HIGH (ref 4.0–10.5)

## 2013-11-29 LAB — CBC
HCT: 31.6 % — ABNORMAL LOW (ref 39.0–52.0)
HEMOGLOBIN: 9.9 g/dL — AB (ref 13.0–17.0)
MCH: 26.5 pg (ref 26.0–34.0)
MCHC: 31.3 g/dL (ref 30.0–36.0)
MCV: 84.7 fL (ref 78.0–100.0)
Platelets: 283 10*3/uL (ref 150–400)
RBC: 3.73 MIL/uL — ABNORMAL LOW (ref 4.22–5.81)
RDW: 17.7 % — ABNORMAL HIGH (ref 11.5–15.5)
WBC: 13.8 10*3/uL — ABNORMAL HIGH (ref 4.0–10.5)

## 2013-11-29 LAB — GLUCOSE, CAPILLARY
GLUCOSE-CAPILLARY: 150 mg/dL — AB (ref 70–99)
Glucose-Capillary: 140 mg/dL — ABNORMAL HIGH (ref 70–99)
Glucose-Capillary: 183 mg/dL — ABNORMAL HIGH (ref 70–99)
Glucose-Capillary: 195 mg/dL — ABNORMAL HIGH (ref 70–99)

## 2013-11-29 LAB — BASIC METABOLIC PANEL
BUN: 27 mg/dL — AB (ref 6–23)
CALCIUM: 8 mg/dL — AB (ref 8.4–10.5)
CO2: 26 meq/L (ref 19–32)
CREATININE: 0.73 mg/dL (ref 0.50–1.35)
Chloride: 99 mEq/L (ref 96–112)
GFR calc Af Amer: >90 mL/min (ref >90)
GFR calc non Af Amer: >90 mL/min (ref >90)
Glucose, Bld: 208 mg/dL — ABNORMAL HIGH (ref 70–99)
Potassium: 5.2 mEq/L (ref 3.7–5.3)
Sodium: 137 mEq/L (ref 137–147)

## 2013-11-29 LAB — POCT I-STAT 3, ART BLOOD GAS (G3+)
Bicarbonate: 26.5 meq/L — ABNORMAL HIGH (ref 20.0–24.0)
O2 Saturation: 95 %
PH ART: 7.347 — AB (ref 7.350–7.450)
Patient temperature: 97.6
TCO2: 28 mmol/L (ref 0–100)
pCO2 arterial: 48.1 mmHg — ABNORMAL HIGH (ref 35.0–45.0)
pO2, Arterial: 81 mmHg (ref 80.0–100.0)

## 2013-11-29 LAB — POCT I-STAT GLUCOSE
GLUCOSE: 166 mg/dL — AB (ref 70–99)
Glucose, Bld: 160 mg/dL — ABNORMAL HIGH (ref 70–99)
Glucose, Bld: 179 mg/dL — ABNORMAL HIGH (ref 70–99)
OPERATOR ID: 295891
OPERATOR ID: 295891
Operator id: 295891

## 2013-11-29 LAB — PREPARE RBC (CROSSMATCH)

## 2013-11-29 LAB — MRSA PCR SCREENING: MRSA by PCR: NEGATIVE

## 2013-11-29 LAB — ABO/RH: ABO/RH(D): O POS

## 2013-11-29 SURGERY — OPEN REDUCTION INTERNAL FIXATION (ORIF) DISTAL RADIUS FRACTURE
Anesthesia: General | Site: Leg Upper | Laterality: Right

## 2013-11-29 MED ORDER — NEOSTIGMINE METHYLSULFATE 1 MG/ML IJ SOLN
INTRAMUSCULAR | Status: AC
  Filled 2013-11-29: qty 20

## 2013-11-29 MED ORDER — FENTANYL CITRATE 0.05 MG/ML IJ SOLN
INTRAMUSCULAR | Status: DC | PRN
  Administered 2013-11-29 (×4): 50 ug via INTRAVENOUS
  Administered 2013-11-29: 100 ug via INTRAVENOUS
  Administered 2013-11-29: 50 ug via INTRAVENOUS
  Administered 2013-11-29: 100 ug via INTRAVENOUS
  Administered 2013-11-29 (×2): 50 ug via INTRAVENOUS
  Administered 2013-11-29 (×2): 100 ug via INTRAVENOUS

## 2013-11-29 MED ORDER — MIDAZOLAM HCL 2 MG/2ML IJ SOLN
INTRAMUSCULAR | Status: AC
  Filled 2013-11-29: qty 2

## 2013-11-29 MED ORDER — FENTANYL CITRATE 0.05 MG/ML IJ SOLN
INTRAMUSCULAR | Status: AC
  Filled 2013-11-29: qty 5

## 2013-11-29 MED ORDER — FENTANYL CITRATE 0.05 MG/ML IJ SOLN
INTRAMUSCULAR | Status: AC
Start: 2013-11-29 — End: 2013-11-29
  Filled 2013-11-29: qty 5

## 2013-11-29 MED ORDER — ALBUMIN HUMAN 5 % IV SOLN
INTRAVENOUS | Status: DC | PRN
  Administered 2013-11-29: 15:00:00 via INTRAVENOUS

## 2013-11-29 MED ORDER — CEFAZOLIN SODIUM-DEXTROSE 2-3 GM-% IV SOLR
2.0000 g | INTRAVENOUS | Status: AC
  Administered 2013-11-29: 2 g via INTRAVENOUS
  Filled 2013-11-29: qty 50

## 2013-11-29 MED ORDER — VECURONIUM BROMIDE 10 MG IV SOLR
INTRAVENOUS | Status: AC
  Filled 2013-11-29: qty 10

## 2013-11-29 MED ORDER — 0.9 % SODIUM CHLORIDE (POUR BTL) OPTIME
TOPICAL | Status: DC | PRN
  Administered 2013-11-29 (×2): 1000 mL

## 2013-11-29 MED ORDER — LIDOCAINE HCL 2 % EX GEL
CUTANEOUS | Status: AC
  Filled 2013-11-29: qty 20

## 2013-11-29 MED ORDER — SODIUM CHLORIDE 0.9 % IR SOLN
Status: DC | PRN
  Administered 2013-11-29: 3000 mL

## 2013-11-29 MED ORDER — BUPIVACAINE HCL 0.25 % IJ SOLN
INTRAMUSCULAR | Status: DC | PRN
  Administered 2013-11-29: 10 mL

## 2013-11-29 MED ORDER — ENOXAPARIN SODIUM 40 MG/0.4ML ~~LOC~~ SOLN
40.0000 mg | SUBCUTANEOUS | Status: DC
  Administered 2013-11-30 – 2013-12-03 (×4): 40 mg via SUBCUTANEOUS
  Filled 2013-11-29 (×6): qty 0.4

## 2013-11-29 MED ORDER — VECURONIUM BROMIDE 10 MG IV SOLR
INTRAVENOUS | Status: DC | PRN
  Administered 2013-11-29: 2 mg via INTRAVENOUS
  Administered 2013-11-29: 3 mg via INTRAVENOUS
  Administered 2013-11-29 (×2): 2 mg via INTRAVENOUS
  Administered 2013-11-29: 4 mg via INTRAVENOUS
  Administered 2013-11-29: 3 mg via INTRAVENOUS
  Administered 2013-11-29 (×2): 2 mg via INTRAVENOUS
  Administered 2013-11-29: 3 mg via INTRAVENOUS
  Administered 2013-11-29: 1 mg via INTRAVENOUS

## 2013-11-29 MED ORDER — KCL IN DEXTROSE-NACL 20-5-0.9 MEQ/L-%-% IV SOLN
INTRAVENOUS | Status: DC
  Administered 2013-11-29: 100 mL via INTRAVENOUS
  Administered 2013-11-30: 100 mL/h via INTRAVENOUS
  Filled 2013-11-29 (×4): qty 1000

## 2013-11-29 MED ORDER — ONDANSETRON HCL 4 MG/2ML IJ SOLN
INTRAMUSCULAR | Status: AC
  Filled 2013-11-29: qty 2

## 2013-11-29 MED ORDER — CEFAZOLIN SODIUM-DEXTROSE 2-3 GM-% IV SOLR
INTRAVENOUS | Status: DC | PRN
  Administered 2013-11-29 (×2): 2 g via INTRAVENOUS

## 2013-11-29 MED ORDER — ARTIFICIAL TEARS OP OINT
TOPICAL_OINTMENT | OPHTHALMIC | Status: AC
  Filled 2013-11-29: qty 3.5

## 2013-11-29 MED ORDER — LACTATED RINGERS IV SOLN
INTRAVENOUS | Status: DC | PRN
  Administered 2013-11-29 (×3): via INTRAVENOUS

## 2013-11-29 MED ORDER — GLYCOPYRROLATE 0.2 MG/ML IJ SOLN
INTRAMUSCULAR | Status: AC
  Filled 2013-11-29: qty 4

## 2013-11-29 MED ORDER — INSULIN ASPART 100 UNIT/ML ~~LOC~~ SOLN
0.0000 [IU] | SUBCUTANEOUS | Status: DC
  Administered 2013-11-29 – 2013-12-01 (×8): 2 [IU] via SUBCUTANEOUS
  Administered 2013-12-01 (×2): 1 [IU] via SUBCUTANEOUS
  Administered 2013-12-01: 2 [IU] via SUBCUTANEOUS
  Administered 2013-12-01: 16:00:00 via SUBCUTANEOUS
  Administered 2013-12-01: 2 [IU] via SUBCUTANEOUS
  Administered 2013-12-02: 1 [IU] via SUBCUTANEOUS
  Administered 2013-12-03: 2 [IU] via SUBCUTANEOUS
  Administered 2013-12-03: 1 [IU] via SUBCUTANEOUS
  Administered 2013-12-03 (×4): 2 [IU] via SUBCUTANEOUS
  Administered 2013-12-04 (×2): 1 [IU] via SUBCUTANEOUS
  Administered 2013-12-04 (×2): 2 [IU] via SUBCUTANEOUS
  Administered 2013-12-04 – 2013-12-06 (×8): 1 [IU] via SUBCUTANEOUS
  Administered 2013-12-06: 2 [IU] via SUBCUTANEOUS
  Administered 2013-12-06 (×2): 1 [IU] via SUBCUTANEOUS
  Administered 2013-12-06 – 2013-12-07 (×3): 2 [IU] via SUBCUTANEOUS
  Administered 2013-12-07: 1 [IU] via SUBCUTANEOUS

## 2013-11-29 MED ORDER — MORPHINE SULFATE 4 MG/ML IJ SOLN: 4.0000 mg | INTRAMUSCULAR | Status: DC | PRN

## 2013-11-29 MED ORDER — PHENYLEPHRINE HCL 10 MG/ML IJ SOLN
INTRAMUSCULAR | Status: DC | PRN
  Administered 2013-11-29: 120 ug via INTRAVENOUS
  Administered 2013-11-29: 80 ug via INTRAVENOUS
  Administered 2013-11-29: 120 ug via INTRAVENOUS
  Administered 2013-11-29: 80 ug via INTRAVENOUS
  Administered 2013-11-29: 40 ug via INTRAVENOUS

## 2013-11-29 MED ORDER — BUPIVACAINE HCL (PF) 0.25 % IJ SOLN
INTRAMUSCULAR | Status: AC
  Filled 2013-11-29: qty 60

## 2013-11-29 MED ORDER — OXYCODONE HCL 5 MG PO TABS: 10.0000 mg | ORAL_TABLET | ORAL | Status: DC | PRN

## 2013-11-29 MED ORDER — PROPOFOL 10 MG/ML IV EMUL
5.0000 ug/kg/min | INTRAVENOUS | Status: DC
  Administered 2013-11-29: 70 ug/kg/min via INTRAVENOUS
  Administered 2013-11-29: 60 ug/kg/min via INTRAVENOUS
  Administered 2013-11-30 (×3): 40 ug/kg/min via INTRAVENOUS
  Administered 2013-11-30: 60 ug/kg/min via INTRAVENOUS
  Administered 2013-11-30: 45 ug/kg/min via INTRAVENOUS
  Administered 2013-12-01 (×2): 40 ug/kg/min via INTRAVENOUS
  Filled 2013-11-29: qty 200
  Filled 2013-11-29 (×10): qty 100

## 2013-11-29 MED ORDER — LIDOCAINE HCL (CARDIAC) 20 MG/ML IV SOLN
INTRAVENOUS | Status: DC | PRN
  Administered 2013-11-29: 80 mg via INTRAVENOUS

## 2013-11-29 MED ORDER — DEXTROSE 5 % IV SOLN
10.0000 mg | INTRAVENOUS | Status: DC | PRN
  Administered 2013-11-29: 20 ug/min via INTRAVENOUS

## 2013-11-29 MED ORDER — MIDAZOLAM HCL 5 MG/5ML IJ SOLN
INTRAMUSCULAR | Status: DC | PRN
  Administered 2013-11-29: 2 mg via INTRAVENOUS
  Administered 2013-11-29: 4 mg via INTRAVENOUS

## 2013-11-29 MED ORDER — CEFAZOLIN SODIUM-DEXTROSE 2-3 GM-% IV SOLR
INTRAVENOUS | Status: AC
  Filled 2013-11-29: qty 50

## 2013-11-29 MED ORDER — ROCURONIUM BROMIDE 100 MG/10ML IV SOLN
INTRAVENOUS | Status: DC | PRN
  Administered 2013-11-29: 50 mg via INTRAVENOUS

## 2013-11-29 MED ORDER — ARTIFICIAL TEARS OP OINT
TOPICAL_OINTMENT | OPHTHALMIC | Status: DC | PRN
  Administered 2013-11-29: 1 via OPHTHALMIC

## 2013-11-29 MED ORDER — LACTATED RINGERS IV SOLN
INTRAVENOUS | Status: DC | PRN
  Administered 2013-11-29 (×3): via INTRAVENOUS

## 2013-11-29 MED ORDER — CHLORHEXIDINE GLUCONATE 4 % EX LIQD
60.0000 mL | Freq: Once | CUTANEOUS | Status: AC
  Administered 2013-11-29: 4 via TOPICAL
  Filled 2013-11-29: qty 60

## 2013-11-29 MED ORDER — LIDOCAINE HCL (CARDIAC) 20 MG/ML IV SOLN
INTRAVENOUS | Status: AC
  Filled 2013-11-29: qty 5

## 2013-11-29 MED ORDER — PROPOFOL 10 MG/ML IV BOLUS
INTRAVENOUS | Status: DC | PRN
  Administered 2013-11-29: 30 mg via INTRAVENOUS
  Administered 2013-11-29 (×2): 100 mg via INTRAVENOUS

## 2013-11-29 MED ORDER — TRAMADOL HCL 50 MG PO TABS: 100.0000 mg | ORAL_TABLET | Freq: Four times a day (QID) | ORAL | Status: DC

## 2013-11-29 MED ORDER — PROPOFOL 10 MG/ML IV BOLUS
INTRAVENOUS | Status: AC
  Filled 2013-11-29: qty 20

## 2013-11-29 MED ORDER — METOPROLOL TARTRATE 1 MG/ML IV SOLN
5.0000 mg | Freq: Four times a day (QID) | INTRAVENOUS | Status: DC | PRN
  Administered 2013-11-30 – 2013-12-01 (×3): 5 mg via INTRAVENOUS
  Filled 2013-11-29 (×3): qty 5

## 2013-11-29 SURGICAL SUPPLY — 200 items
APPLIER CLIP 11 MED OPEN (CLIP)
BANDAGE ELASTIC 3 VELCRO ST LF (GAUZE/BANDAGES/DRESSINGS) ×5 IMPLANT
BANDAGE ELASTIC 4 VELCRO ST LF (GAUZE/BANDAGES/DRESSINGS) ×10 IMPLANT
BANDAGE ELASTIC 6 VELCRO ST LF (GAUZE/BANDAGES/DRESSINGS) ×10 IMPLANT
BANDAGE ESMARK 6X9 LF (GAUZE/BANDAGES/DRESSINGS) IMPLANT
BANDAGE GAUZE ELAST BULKY 4 IN (GAUZE/BANDAGES/DRESSINGS) ×5 IMPLANT
BIT DRILL 100X2.5XANTM LCK (BIT) ×3 IMPLANT
BIT DRILL 2.0 LNG QUCK RELEASE (BIT) ×6 IMPLANT
BIT DRILL 2.8X5 QR DISP (BIT) ×5 IMPLANT
BIT DRILL AO MATTA 2.5MX230M (BIT) ×3 IMPLANT
BIT DRILL TWST MATTA 3.5MX195M (BIT) ×3 IMPLANT
BIT DRL 100X2.5XANTM LCK (BIT) ×3
BLADE 10 SAFETY STRL DISP (BLADE) IMPLANT
BLADE SURG 10 STRL SS (BLADE) IMPLANT
BLADE SURG 15 STRL LF DISP TIS (BLADE) IMPLANT
BLADE SURG 15 STRL SS (BLADE)
BLADE SURG ROTATE 9660 (MISCELLANEOUS) ×5 IMPLANT
BNDG COHESIVE 4X5 TAN STRL (GAUZE/BANDAGES/DRESSINGS) IMPLANT
BNDG COHESIVE 6X5 TAN STRL LF (GAUZE/BANDAGES/DRESSINGS) ×5 IMPLANT
BNDG ESMARK 4X9 LF (GAUZE/BANDAGES/DRESSINGS) ×5 IMPLANT
BNDG ESMARK 6X9 LF (GAUZE/BANDAGES/DRESSINGS)
BNDG GAUZE ELAST 4 BULKY (GAUZE/BANDAGES/DRESSINGS) ×10 IMPLANT
BONE CHIP PRESERV 20CC (Bone Implant) ×5 IMPLANT
BRUSH SCRUB DISP (MISCELLANEOUS) IMPLANT
CANISTER SUCT 3000ML (MISCELLANEOUS) ×5 IMPLANT
CLIP APPLIE 11 MED OPEN (CLIP) IMPLANT
CLOSURE WOUND 1/2 X4 (GAUZE/BANDAGES/DRESSINGS)
CORDS BIPOLAR (ELECTRODE) ×5 IMPLANT
COVER BACK TABLE 24X17X13 BIG (DRAPES) ×5 IMPLANT
COVER MAYO STAND STRL (DRAPES) ×5 IMPLANT
COVER SURGICAL LIGHT HANDLE (MISCELLANEOUS) ×25 IMPLANT
CUFF TOURNIQUET SINGLE 18IN (TOURNIQUET CUFF) ×5 IMPLANT
CUFF TOURNIQUET SINGLE 24IN (TOURNIQUET CUFF) IMPLANT
DECANTER SPIKE VIAL GLASS SM (MISCELLANEOUS) ×5 IMPLANT
DRAIN CHANNEL 10F 3/8 F FF (DRAIN) IMPLANT
DRAIN CHANNEL 15F RND FF W/TCR (WOUND CARE) IMPLANT
DRAIN TLS ROUND 10FR (DRAIN) IMPLANT
DRAPE C-ARM 42X72 X-RAY (DRAPES) ×10 IMPLANT
DRAPE C-ARMOR (DRAPES) ×10 IMPLANT
DRAPE INCISE IOBAN 66X45 STRL (DRAPES) ×5 IMPLANT
DRAPE INCISE IOBAN 85X60 (DRAPES) ×15 IMPLANT
DRAPE OEC MINIVIEW 54X84 (DRAPES) IMPLANT
DRAPE ORTHO SPLIT 77X108 STRL (DRAPES) ×6
DRAPE PROXIMA HALF (DRAPES) ×5 IMPLANT
DRAPE SURG 17X23 STRL (DRAPES) ×5 IMPLANT
DRAPE SURG ORHT 6 SPLT 77X108 (DRAPES) ×9 IMPLANT
DRAPE U-SHAPE 47X51 STRL (DRAPES) ×5 IMPLANT
DRILL 2.0 LNG QUICK RELEASE (BIT) ×10
DRILL BIT 2.5MM (BIT) ×2
DRILL BIT 2.7X100 214235006 DU (MISCELLANEOUS) ×5 IMPLANT
DRILL BIT AO MATTA 2.5MX230M (BIT) ×5
DRILL TWIST AO MATTA 3.5MX195M (BIT) ×5
DRSG ADAPTIC 3X8 NADH LF (GAUZE/BANDAGES/DRESSINGS) ×10 IMPLANT
DRSG EMULSION OIL 3X3 NADH (GAUZE/BANDAGES/DRESSINGS) IMPLANT
DRSG MEPILEX BORDER 4X12 (GAUZE/BANDAGES/DRESSINGS) ×5 IMPLANT
DRSG PAD ABDOMINAL 8X10 ST (GAUZE/BANDAGES/DRESSINGS) ×10 IMPLANT
ELECT BLADE 4.0 EZ CLEAN MEGAD (MISCELLANEOUS) ×5
ELECT BLADE 6.5 EXT (BLADE) IMPLANT
ELECT REM PT RETURN 9FT ADLT (ELECTROSURGICAL) ×5
ELECTRODE BLDE 4.0 EZ CLN MEGD (MISCELLANEOUS) ×3 IMPLANT
ELECTRODE REM PT RTRN 9FT ADLT (ELECTROSURGICAL) ×3 IMPLANT
EVACUATOR 1/8 PVC DRAIN (DRAIN) IMPLANT
EVACUATOR 3/16  PVC DRAIN (DRAIN)
EVACUATOR 3/16 PVC DRAIN (DRAIN) IMPLANT
EVACUATOR SILICONE 100CC (DRAIN) ×5 IMPLANT
GAUZE SPONGE 4X4 16PLY XRAY LF (GAUZE/BANDAGES/DRESSINGS) ×5 IMPLANT
GAUZE XEROFORM 1X8 LF (GAUZE/BANDAGES/DRESSINGS) ×5 IMPLANT
GLOVE BIO SURGEON STRL SZ7.5 (GLOVE) ×15 IMPLANT
GLOVE BIO SURGEON STRL SZ8 (GLOVE) ×10 IMPLANT
GLOVE BIOGEL PI IND STRL 6 (GLOVE) ×3 IMPLANT
GLOVE BIOGEL PI IND STRL 6.5 (GLOVE) ×6 IMPLANT
GLOVE BIOGEL PI IND STRL 7.0 (GLOVE) ×6 IMPLANT
GLOVE BIOGEL PI IND STRL 7.5 (GLOVE) ×9 IMPLANT
GLOVE BIOGEL PI IND STRL 8 (GLOVE) ×6 IMPLANT
GLOVE BIOGEL PI INDICATOR 6 (GLOVE) ×2
GLOVE BIOGEL PI INDICATOR 6.5 (GLOVE) ×4
GLOVE BIOGEL PI INDICATOR 7.0 (GLOVE) ×4
GLOVE BIOGEL PI INDICATOR 7.5 (GLOVE) ×6
GLOVE BIOGEL PI INDICATOR 8 (GLOVE) ×4
GOWN STRL REUS W/ TWL LRG LVL3 (GOWN DISPOSABLE) ×18 IMPLANT
GOWN STRL REUS W/ TWL XL LVL3 (GOWN DISPOSABLE) ×18 IMPLANT
GOWN STRL REUS W/TWL 2XL LVL3 (GOWN DISPOSABLE) ×5 IMPLANT
GOWN STRL REUS W/TWL LRG LVL3 (GOWN DISPOSABLE) ×12
GOWN STRL REUS W/TWL XL LVL3 (GOWN DISPOSABLE) ×12
GUIDEWIRE ORTHO 0.054X6 (WIRE) ×5 IMPLANT
HANDPIECE INTERPULSE COAX TIP (DISPOSABLE) ×2
IMMOBILIZER KNEE 20 (SOFTGOODS) ×5 IMPLANT
IMMOBILIZER KNEE 22 UNIV (SOFTGOODS) ×5 IMPLANT
K-WIRE 3.2X150 (WIRE) ×5
K-WIRE ACE 1.6X6 (WIRE) ×10
KIT BASIN OR (CUSTOM PROCEDURE TRAY) ×10 IMPLANT
KIT ROOM TURNOVER OR (KITS) ×10 IMPLANT
KWIRE 3.2X150 (WIRE) ×3 IMPLANT
KWIRE ACE 1.6X6 (WIRE) ×6 IMPLANT
LIGHT ORTHO (MISCELLANEOUS) IMPLANT
LOOP VESSEL MAXI BLUE (MISCELLANEOUS) IMPLANT
MANIFOLD NEPTUNE II (INSTRUMENTS) ×15 IMPLANT
NDL SUT 6 .5 CRC .975X.05 MAYO (NEEDLE) IMPLANT
NEEDLE 22X1 1/2 (OR ONLY) (NEEDLE) ×5 IMPLANT
NEEDLE HYPO 21X1.5 SAFETY (NEEDLE) IMPLANT
NEEDLE MAYO TAPER (NEEDLE)
NEEDLE MAYO TROCAR (NEEDLE) IMPLANT
NS IRRIG 1000ML POUR BTL (IV SOLUTION) ×10 IMPLANT
PACK GENERAL/GYN (CUSTOM PROCEDURE TRAY) IMPLANT
PACK ORTHO EXTREMITY (CUSTOM PROCEDURE TRAY) ×5 IMPLANT
PACK TOTAL JOINT (CUSTOM PROCEDURE TRAY) ×5 IMPLANT
PAD ARMBOARD 7.5X6 YLW CONV (MISCELLANEOUS) ×20 IMPLANT
PAD CAST 4YDX4 CTTN HI CHSV (CAST SUPPLIES) ×3 IMPLANT
PADDING CAST ABS 4INX4YD NS (CAST SUPPLIES) ×2
PADDING CAST ABS COTTON 4X4 ST (CAST SUPPLIES) ×3 IMPLANT
PADDING CAST COTTON 4X4 STRL (CAST SUPPLIES) ×2
PADDING CAST COTTON 6X4 STRL (CAST SUPPLIES) ×5 IMPLANT
PASSER SUT SWANSON 36MM LOOP (INSTRUMENTS) ×5 IMPLANT
PENCIL BUTTON HOLSTER BLD 10FT (ELECTRODE) ×10 IMPLANT
PIN APEX 200X6MM (Pin) ×5 IMPLANT
PIPE LIGHT STORZ FITTING (MISCELLANEOUS) ×5 IMPLANT
PLATE 6H RT DIST ANTLAT TIB (Plate) ×2 IMPLANT
PLATE ACET STRT 46.5M 4H (Plate) ×5 IMPLANT
PLATE ACET STRT 94.5M 8H (Plate) ×5 IMPLANT
PLATE ANTLAT CNTR NAR 114X6 (Plate) ×3 IMPLANT
PLATE LOCK LG 7H LT PROX TIB (Plate) ×5 IMPLANT
PLATE STD RT ACULOC 2 (Plate) ×5 IMPLANT
RETRIEVER SUT HEWSON (MISCELLANEOUS) IMPLANT
SCREW BN FT 16X2.3XLCK HEX CRT (Screw) ×3 IMPLANT
SCREW CORT 3.5X32 815037032 (Screw) ×5 IMPLANT
SCREW CORT 48X3.5XST NS LF (Screw) ×3 IMPLANT
SCREW CORT FT 18X2.3XLCK HEX (Screw) ×3 IMPLANT
SCREW CORTEX ST MATTA 3.5X22MM (Screw) ×5 IMPLANT
SCREW CORTEX ST MATTA 3.5X26MM (Screw) ×10 IMPLANT
SCREW CORTEX ST MATTA 3.5X32MM (Screw) ×10 IMPLANT
SCREW CORTEX ST MATTA 3.5X60MM (Screw) ×5 IMPLANT
SCREW CORTICAL 3.5MM  28MM (Screw) ×2 IMPLANT
SCREW CORTICAL 3.5MM 28MM (Screw) ×3 IMPLANT
SCREW CORTICAL 3.5MM 36MM (Screw) ×5 IMPLANT
SCREW CORTICAL 3.5MM 38MM (Screw) ×10 IMPLANT
SCREW CORTICAL 3.5X42MM (Screw) ×10 IMPLANT
SCREW CORTICAL 3.5X48MM (Screw) ×2 IMPLANT
SCREW CORTICAL LOCKING 2.3X16M (Screw) ×4 IMPLANT
SCREW CORTICAL LOCKING 2.3X18M (Screw) ×4 IMPLANT
SCREW CORTICAL LOCKING 2.3X20M (Screw) ×2 IMPLANT
SCREW FX16X2.3XLCK SMTH NS CRT (Screw) ×3 IMPLANT
SCREW FX18X2.3XSMTH LCK NS CRT (Screw) ×3 IMPLANT
SCREW FX20X2.3XSMTH LCK NS CRT (Screw) ×3 IMPLANT
SCREW HEXALOBE NON-LOCK 3.5X14 (Screw) ×10 IMPLANT
SCREW LOCK 3.5X50 DIST TIB (Screw) ×5 IMPLANT
SCREW LOCK CORT STAR 3.5X38 (Screw) ×15 IMPLANT
SCREW LOCK CORT STAR 3.5X70 (Screw) ×10 IMPLANT
SCREW LOCK CORT STAR 3.5X75 (Screw) ×20 IMPLANT
SCREW LOW PROFILE 3.5MMX42 (Screw) ×5 IMPLANT
SCREW LP 3.5X85MM (Screw) ×5 IMPLANT
SCREW NL LP 36X3.5 (Screw) ×10 IMPLANT
SCREW NLCKG 13 3.5X13 HEXA (Screw) ×3 IMPLANT
SCREW NON TOGG 2.3X20MM (Screw) ×5 IMPLANT
SCREW NON-LOCK 3.5X13 (Screw) ×5 IMPLANT
SET HNDPC FAN SPRY TIP SCT (DISPOSABLE) ×3 IMPLANT
SPLINT FIBERGLASS 3X12 (CAST SUPPLIES) ×5 IMPLANT
SPLINT FIBERGLASS 4X30 (CAST SUPPLIES) ×5 IMPLANT
SPONGE GAUZE 4X4 12PLY (GAUZE/BANDAGES/DRESSINGS) ×5 IMPLANT
SPONGE GAUZE 4X4 12PLY STER LF (GAUZE/BANDAGES/DRESSINGS) ×15 IMPLANT
SPONGE LAP 18X18 X RAY DECT (DISPOSABLE) ×15 IMPLANT
SPONGE LAP 4X18 X RAY DECT (DISPOSABLE) IMPLANT
SPONGE SCRUB IODOPHOR (GAUZE/BANDAGES/DRESSINGS) ×5 IMPLANT
STAPLER VISISTAT 35W (STAPLE) ×5 IMPLANT
STOCKINETTE IMPERVIOUS LG (DRAPES) ×10 IMPLANT
STRIP CLOSURE SKIN 1/2X4 (GAUZE/BANDAGES/DRESSINGS) IMPLANT
SUCTION FRAZIER TIP 10 FR DISP (SUCTIONS) ×5 IMPLANT
SUT ETHILON 2 0 FS 18 (SUTURE) ×10 IMPLANT
SUT ETHILON 3 0 FSL (SUTURE) ×5 IMPLANT
SUT ETHILON 3 0 PS 1 (SUTURE) ×10 IMPLANT
SUT ETHILON 4 0 PS 2 18 (SUTURE) ×10 IMPLANT
SUT FIBERWIRE #2 38 T-5 BLUE (SUTURE) ×10
SUT MNCRL AB 4-0 PS2 18 (SUTURE) ×5 IMPLANT
SUT PDS AB 2-0 CT1 27 (SUTURE) IMPLANT
SUT PROLENE 0 CT 2 (SUTURE) IMPLANT
SUT PROLENE 3 0 PS 2 (SUTURE) IMPLANT
SUT VIC AB 0 CT1 27 (SUTURE) ×4
SUT VIC AB 0 CT1 27XBRD ANBCTR (SUTURE) ×6 IMPLANT
SUT VIC AB 1 CT1 18XCR BRD 8 (SUTURE) ×3 IMPLANT
SUT VIC AB 1 CT1 27 (SUTURE)
SUT VIC AB 1 CT1 27XBRD ANBCTR (SUTURE) IMPLANT
SUT VIC AB 1 CT1 8-18 (SUTURE) ×2
SUT VIC AB 2-0 CT1 27 (SUTURE) ×6
SUT VIC AB 2-0 CT1 TAPERPNT 27 (SUTURE) ×9 IMPLANT
SUT VIC AB 2-0 CT3 27 (SUTURE) IMPLANT
SUT VIC AB 3-0 FS2 27 (SUTURE) IMPLANT
SUT VIC AB 4-0 SH 27 (SUTURE) ×2
SUT VIC AB 4-0 SH 27XBRD (SUTURE) ×3 IMPLANT
SUTURE FIBERWR #2 38 T-5 BLUE (SUTURE) ×6 IMPLANT
SYR 20ML ECCENTRIC (SYRINGE) IMPLANT
SYR CONTROL 10ML LL (SYRINGE) ×10 IMPLANT
SYSTEM CHEST DRAIN TLS 7FR (DRAIN) IMPLANT
TOWEL OR 17X24 6PK STRL BLUE (TOWEL DISPOSABLE) ×15 IMPLANT
TOWEL OR 17X26 10 PK STRL BLUE (TOWEL DISPOSABLE) ×15 IMPLANT
TRAY FOLEY CATH 16FRSI W/METER (SET/KITS/TRAYS/PACK) IMPLANT
TUBE CONNECTING 12'X1/4 (SUCTIONS) ×1
TUBE CONNECTING 12X1/4 (SUCTIONS) ×4 IMPLANT
TUBE EVACUATION TLS (MISCELLANEOUS) IMPLANT
UNDERPAD 30X30 INCONTINENT (UNDERPADS AND DIAPERS) ×15 IMPLANT
WATER STERILE IRR 1000ML POUR (IV SOLUTION) IMPLANT
YANKAUER SUCT BULB TIP NO VENT (SUCTIONS) ×5 IMPLANT
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
## 2013-11-29 NOTE — Brief Op Note (Addendum)
11/25/2013 - 11/29/2013  2:17 PM  PATIENT:  [name]  63 y.o. male  PRE-OPERATIVE DIAGNOSIS:  LEFT POSTERIOR WALL ACETABULAR FRACTURE, LEFT TIBIAL PLATEAU FRACTURE, RIGHT DISTAL RADIUS FRACTURE, RIGHT TIBIAL PILON FRACTURE  POST-OPERATIVE DIAGNOSIS:  LEFT POSTERIOR WALL ACETABULAR FRACTURE, LEFT TIBIAL PLATEAU FRACTURE, RIGHT DISTAL RADIUS FRACTURE, RIGHT TIBIAL PILON FRACTURE  PROCEDURE:  Procedure(s): OPEN REDUCTION INTERNAL FIXATION (ORIF) RIGHT DISTAL RADIAL FRACTURE (Right) OPEN REDUCTION INTERNAL FIXATION (ORIF) TIBIAL PLATEAU (Left) OPEN REDUCTION INTERNAL FIXATION (ORIF) PILON FRACTURE (Right) OPEN REDUCTION INTERNAL FIXATION (ORIF) ACETABULAR FRACTURE (Left)  SURGEON:  Surgeon(s) and Role: Panel 1:    * Tami RibasKevin R Kuzma, MD - Primary  Panel 2:    * Budd PalmerMichael H Shahidah Nesbitt, MD - Primary  PHYSICIAN ASSISTANT:   ASSISTANTS: none   ANESTHESIA:   general  EBL:  Total I/O In: 1000 [I.V.:1000] Out: -   BLOOD ADMINISTERED:none  DRAINS: none   LOCAL MEDICATIONS USED:  MARCAINE     SPECIMEN:  No Specimen  DISPOSITION OF SPECIMEN:  N/A  COUNTS:  YES  TOURNIQUET:  * Missing tourniquet times found for documented tourniquets in log:  [id number] *  DICTATION: .Other Dictation: Dictation Number [id number]  PLAN OF CARE: Discharge to home after PACU  PATIENT DISPOSITION:  PACU - hemodynamically stable.   Delay start of Pharmacological VTE agent (>24hrs) due to surgical blood loss or risk of bleeding: no   MH dictation: [phone]

## 2013-11-29 NOTE — Op Note (Signed)
[id number] 

## 2013-11-29 NOTE — Discharge Instructions (Signed)

## 2013-11-29 NOTE — Anesthesia Procedure Notes (Signed)
Procedure Name: Intubation Date/Time: 11/29/2013 12:32 PM Performed by: Margaree MackintoshYACOUB, Clance Baquero B Pre-anesthesia Checklist: Patient identified, Timeout performed, Emergency Drugs available, Suction available and Patient being monitored Patient Re-evaluated:Patient Re-evaluated prior to inductionOxygen Delivery Method: Circle system utilized Preoxygenation: Pre-oxygenation with 100% oxygen Intubation Type: IV induction Ventilation: Mask ventilation without difficulty Laryngoscope Size: Mac and 3 Grade View: Grade I Tube type: Oral Tube size: 7.5 mm Number of attempts: 1 Placement Confirmation: ETT inserted through vocal cords under direct vision,  positive ETCO2 and breath sounds checked- equal and bilateral Secured at: 22 cm Tube secured with: Tape Dental Injury: Teeth and Oropharynx as per pre-operative assessment

## 2013-11-29 NOTE — Op Note (Signed)
Intra-operative fluoroscopic images in the AP, lateral, and oblique views were taken and evaluated by myself.  Reduction and hardware placement were confirmed.  There was no intraarticular penetration of permanent hardware.  

## 2013-11-29 NOTE — Progress Notes (Signed)
Lots of pain but managing well.  To OR today.  This patient has been seen and I agree with the findings and treatment plan.  Marta LamasJames O. Gae BonWyatt, [name], MD, FACS [phone] (pager) [phone] (direct pager) Trauma Surgeon
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
## 2013-11-29 NOTE — Anesthesia Postprocedure Evaluation (Signed)
  Anesthesia Post-op Note  Patient: [name]  Procedure(s) Performed: Procedure(s): OPEN REDUCTION INTERNAL FIXATION (ORIF) RIGHT DISTAL RADIAL FRACTURE (Right) OPEN REDUCTION INTERNAL FIXATION (ORIF) TIBIAL PLATEAU (Left) OPEN REDUCTION INTERNAL FIXATION (ORIF) PILON FRACTURE (Right) OPEN REDUCTION INTERNAL FIXATION (ORIF) ACETABULAR FRACTURE (Left)  Patient Location: ICU  Anesthesia Type:General  Level of Consciousness: sedated and unresponsive  Airway and Oxygen Therapy: Patient remains intubated and on ventilator  Post-op Pain: none  Post-op Assessment: Post-op Vital signs reviewed, Patient's Cardiovascular Status Stable and Respiratory Function Stable  Post-op Vital Signs: Reviewed  Filed Vitals:   11/29/13 2000  BP:   Pulse:   Temp: 36.4 C  Resp:     Complications: No apparent anesthesia complications

## 2013-11-29 NOTE — Transfer of Care (Signed)
Immediate Anesthesia Transfer of Care Note  Patient: [name]  Procedure(s) Performed: Procedure(s): OPEN REDUCTION INTERNAL FIXATION (ORIF) RIGHT DISTAL RADIAL FRACTURE (Right) OPEN REDUCTION INTERNAL FIXATION (ORIF) TIBIAL PLATEAU (Left) OPEN REDUCTION INTERNAL FIXATION (ORIF) PILON FRACTURE (Right) OPEN REDUCTION INTERNAL FIXATION (ORIF) ACETABULAR FRACTURE (Left)  Patient Location: SICU  Anesthesia Type:General  Level of Consciousness: sedated and Patient remains intubated per anesthesia plan  Airway & Oxygen Therapy: Patient remains intubated per anesthesia plan  Post-op Assessment: Report given to PACU RN and Post -op Vital signs reviewed and stable  Post vital signs: Reviewed and stable  Complications: No apparent anesthesia complications

## 2013-11-29 NOTE — Brief Op Note (Signed)
11/25/2013 - 11/29/2013  8:55 PM  PATIENT:  Hoy Mornhomas Meals  63 y.o. male  PRE-OPERATIVE DIAGNOSIS:  LEFT POSTERIOR WALL ACETABULAR FRACTURE, LEFT TIBIAL PLATEAU FRACTURE, RIGHT DISTAL RADIUS FRACTURE, RIGHT TIBIAL PILON FRACTURE  POST-OPERATIVE DIAGNOSIS:  LEFT POSTERIOR WALL ACETABULAR FRACTURE, LEFT TIBIAL PLATEAU FRACTURE, RIGHT DISTAL RADIUS FRACTURE, RIGHT TIBIAL PILON FRACTURE  PROCEDURE:  Procedure(s): OPEN REDUCTION INTERNAL FIXATION (ORIF) RIGHT DISTAL RADIAL FRACTURE (Right) OPEN REDUCTION INTERNAL FIXATION (ORIF) TIBIAL PLATEAU (Left) OPEN REDUCTION INTERNAL FIXATION (ORIF) PILON FRACTURE (Right) OPEN REDUCTION INTERNAL FIXATION (ORIF) ACETABULAR FRACTURE (Left)  SURGEON:  Surgeon(s) and Role: Panel 1:    * Tami RibasKevin R Kuzma, MD - Primary  Panel 2:    * Budd PalmerMichael H Mikolaj Woolstenhulme, MD - Primary  PHYSICIAN ASSISTANT: Montez MoritaKeith Paul, PA-C  ANESTHESIA:   general  I/O:  Total I/O In: 335 [Blood:335] Out: 50 [Urine:50]  SPECIMEN:  No Specimen  TOURNIQUET:   Total Tourniquet Time Documented: Upper Arm (Right) - 71 minutes Total: Upper Arm (Right) - 71 minutes   DICTATION: .TBA

## 2013-11-29 NOTE — Telephone Encounter (Signed)
Talked to Claris CheMargaret, Charity fundraiserN on 5N [facility].  Advised that [facility] needs to be called to arrange [facility].  Advised her that he may need to be medicated for pain before he is transported.  Margaret verbalized agreement.
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
## 2013-11-30 ENCOUNTER — Ambulatory Visit: Payer: No Typology Code available for payment source | Admitting: Radiation Oncology

## 2013-11-30 ENCOUNTER — Encounter (HOSPITAL_COMMUNITY): Payer: Self-pay | Admitting: Orthopedic Surgery

## 2013-11-30 DIAGNOSIS — J95821 Acute postprocedural respiratory failure: Secondary | ICD-10-CM

## 2013-11-30 DIAGNOSIS — J810 Acute pulmonary edema: Secondary | ICD-10-CM

## 2013-11-30 LAB — GLUCOSE, CAPILLARY
GLUCOSE-CAPILLARY: 161 mg/dL — AB (ref 70–99)
GLUCOSE-CAPILLARY: 166 mg/dL — AB (ref 70–99)
Glucose-Capillary: 148 mg/dL — ABNORMAL HIGH (ref 70–99)
Glucose-Capillary: 153 mg/dL — ABNORMAL HIGH (ref 70–99)
Glucose-Capillary: 156 mg/dL — ABNORMAL HIGH (ref 70–99)
Glucose-Capillary: 157 mg/dL — ABNORMAL HIGH (ref 70–99)

## 2013-11-30 LAB — TYPE AND SCREEN
ABO/RH(D): O POS
ANTIBODY SCREEN: NEGATIVE
Unit division: 0
Unit division: 0

## 2013-11-30 LAB — CBC
HEMATOCRIT: 30.3 % — AB (ref 39.0–52.0)
Hemoglobin: 9.8 g/dL — ABNORMAL LOW (ref 13.0–17.0)
MCH: 26.8 pg (ref 26.0–34.0)
MCHC: 32.3 g/dL (ref 30.0–36.0)
MCV: 83 fL (ref 78.0–100.0)
Platelets: 225 10*3/uL (ref 150–400)
RBC: 3.65 MIL/uL — ABNORMAL LOW (ref 4.22–5.81)
RDW: 16.3 % — ABNORMAL HIGH (ref 11.5–15.5)
WBC: 10.1 10*3/uL (ref 4.0–10.5)

## 2013-11-30 MED ORDER — DEXTROSE-NACL 5-0.9 % IV SOLN
INTRAVENOUS | Status: DC
Start: 2013-11-30 — End: 2013-12-04
  Administered 2013-11-30 – 2013-12-01 (×2): 100 mL/h via INTRAVENOUS
  Administered 2013-12-01 – 2013-12-03 (×3): via INTRAVENOUS

## 2013-11-30 MED ORDER — FENTANYL CITRATE 0.05 MG/ML IJ SOLN
50.0000 ug | INTRAMUSCULAR | Status: DC | PRN
  Administered 2013-11-30 (×4): 100 ug via INTRAVENOUS
  Administered 2013-12-01: 50 ug via INTRAVENOUS
  Administered 2013-12-01 (×2): 100 ug via INTRAVENOUS
  Filled 2013-11-30 (×8): qty 2

## 2013-11-30 MED ORDER — FUROSEMIDE 10 MG/ML IJ SOLN
20.0000 mg | Freq: Once | INTRAMUSCULAR | Status: AC
  Administered 2013-11-30: 20 mg via INTRAVENOUS
  Filled 2013-11-30: qty 2
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
## 2013-11-30 NOTE — Progress Notes (Signed)
Unit social worker received consult for SNF placement. CSW passed this information on to the Trauma social worker who will follow up with patient. Unit social worker signing off.  Maree KrabbeLindsay Kioni Stahl, MSW, Theresia MajorsLCSWA [phone]

## 2013-11-30 NOTE — Progress Notes (Signed)
PT Cancellation Note  Patient Details Name: [name] MRN: [id number] DOB: 1950/12/10   Cancelled Treatment:    Reason Eval/Treat Not Completed: Medical issues which prohibited therapy.  Pt not medically ready for therapy.  Still intubated with no plans to extubate today per nursing. Nursing asked PT to HOLD and check back tomorrow.  Thanks.   [facility] IncDawn Ingold 11/30/2013, 10:57 AM Audree Camelawn Ingold,PT Acute Rehabilitation [phone] (pager)
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
## 2013-11-30 NOTE — Clinical Social Work Note (Signed)
Clinical Social Worker continuing to follow patient and family for support and discharge planning needs.  Patient remains on the ventilator following surgery and [facility]  Patient will likely need SNF at discharge - CSW to pursue once patient removed from the ventilator.  Per MD note, patient hopeful to wean today with possible plans for extubation tomorrow.  CSW to follow up with patient and family to further discuss SNF options at that time.  CSW remains available for support and to facilitate patient discharge needs once medically ready.  Macario GoldsJesse Chanika Byland, Kentucky[place]CS[place] [phone]

## 2013-11-30 NOTE — Progress Notes (Signed)
UR completed.  Haziel Molner, RN BSN M[facility] CCM Trauma/Neuro ICU Case Manager [phone]  

## 2013-11-30 NOTE — Progress Notes (Signed)
OT Cancellation Note  Patient Details Name: [name] MRN: [id number] DOB: 07-Oct-1950   Cancelled Treatment:    Reason Eval/Treat Not Completed: Patient not medically ready (Bedrest, intubated). Ot to hold until appropriate to evaluate.  Harolyn RutherfordJessica B Satoru Milich Pager: [phone]: [phone]  11/30/2013, 7:47 AM

## 2013-12-01 ENCOUNTER — Inpatient Hospital Stay (HOSPITAL_COMMUNITY): Payer: Medicare Other

## 2013-12-01 ENCOUNTER — Institutional Professional Consult (permissible substitution): Payer: Medicare Other | Admitting: Radiation Oncology

## 2013-12-01 ENCOUNTER — Ambulatory Visit: Payer: No Typology Code available for payment source | Admitting: Radiation Oncology

## 2013-12-01 LAB — BASIC METABOLIC PANEL
BUN: 15 mg/dL (ref 6–23)
CALCIUM: 7.8 mg/dL — AB (ref 8.4–10.5)
CO2: 28 meq/L (ref 19–32)
Chloride: 103 mEq/L (ref 96–112)
Creatinine, Ser: 0.62 mg/dL (ref 0.50–1.35)
GFR calc Af Amer: >90 mL/min (ref >90)
GLUCOSE: 161 mg/dL — AB (ref 70–99)
Potassium: 3.8 mEq/L (ref 3.7–5.3)
SODIUM: 143 meq/L (ref 137–147)

## 2013-12-01 LAB — GLUCOSE, CAPILLARY
GLUCOSE-CAPILLARY: 155 mg/dL — AB (ref 70–99)
GLUCOSE-CAPILLARY: 152 mg/dL — AB (ref 70–99)
GLUCOSE-CAPILLARY: 131 mg/dL — AB (ref 70–99)
Glucose-Capillary: 165 mg/dL — ABNORMAL HIGH (ref 70–99)
Glucose-Capillary: 156 mg/dL — ABNORMAL HIGH (ref 70–99)

## 2013-12-01 LAB — CBC
HEMATOCRIT: 28.9 % — AB (ref 39.0–52.0)
HEMOGLOBIN: 9.4 g/dL — AB (ref 13.0–17.0)
MCH: 27.3 pg (ref 26.0–34.0)
MCHC: 32.5 g/dL (ref 30.0–36.0)
MCV: 84 fL (ref 78.0–100.0)
Platelets: 220 10*3/uL (ref 150–400)
RBC: 3.44 MIL/uL — ABNORMAL LOW (ref 4.22–5.81)
RDW: 17.1 % — ABNORMAL HIGH (ref 11.5–15.5)
WBC: 10.3 10*3/uL (ref 4.0–10.5)

## 2013-12-01 MED ORDER — DIAZEPAM 5 MG PO TABS
10.0000 mg | ORAL_TABLET | Freq: Three times a day (TID) | ORAL | Status: DC | PRN
  Administered 2013-12-02 – 2013-12-07 (×11): 10 mg via ORAL
  Filled 2013-12-01 (×11): qty 2

## 2013-12-01 MED ORDER — METOPROLOL TARTRATE 1 MG/ML IV SOLN
5.0000 mg | Freq: Four times a day (QID) | INTRAVENOUS | Status: DC | PRN
  Administered 2013-12-01: 10 mg via INTRAVENOUS
  Administered 2013-12-01 – 2013-12-03 (×4): 5 mg via INTRAVENOUS
  Filled 2013-12-01: qty 5
  Filled 2013-12-01: qty 10
  Filled 2013-12-01: qty 5
  Filled 2013-12-01: qty 10
  Filled 2013-12-01: qty 5

## 2013-12-01 MED ORDER — ATORVASTATIN CALCIUM 80 MG PO TABS
80.0000 mg | ORAL_TABLET | Freq: Every day | ORAL | Status: DC
  Administered 2013-12-01 – 2013-12-07 (×7): 80 mg via ORAL
  Filled 2013-12-01 (×7): qty 1

## 2013-12-01 MED ORDER — METOPROLOL TARTRATE 25 MG PO TABS
25.0000 mg | ORAL_TABLET | Freq: Two times a day (BID) | ORAL | Status: DC
  Administered 2013-12-01 – 2013-12-02 (×3): 25 mg via ORAL
  Filled 2013-12-01 (×5): qty 1

## 2013-12-01 MED ORDER — ENALAPRIL MALEATE 20 MG PO TABS
20.0000 mg | ORAL_TABLET | Freq: Every day | ORAL | Status: DC
  Administered 2013-12-01 – 2013-12-07 (×7): 20 mg via ORAL
  Filled 2013-12-01 (×8): qty 1

## 2013-12-01 MED ORDER — DILTIAZEM HCL ER 120 MG PO CP24
120.0000 mg | ORAL_CAPSULE | Freq: Every day | ORAL | Status: DC
  Administered 2013-12-01 – 2013-12-07 (×7): 120 mg via ORAL
  Filled 2013-12-01 (×8): qty 1

## 2013-12-01 MED ORDER — FENTANYL CITRATE 0.05 MG/ML IJ SOLN
50.0000 ug | INTRAMUSCULAR | Status: DC | PRN
  Administered 2013-12-01 – 2013-12-02 (×13): 150 ug via INTRAVENOUS
  Administered 2013-12-02: 100 ug via INTRAVENOUS
  Administered 2013-12-02: 150 ug via INTRAVENOUS
  Administered 2013-12-02: 100 ug via INTRAVENOUS
  Administered 2013-12-02 (×2): 150 ug via INTRAVENOUS
  Administered 2013-12-03 (×4): 100 ug via INTRAVENOUS
  Filled 2013-12-01: qty 4
  Filled 2013-12-01: qty 2
  Filled 2013-12-01 (×6): qty 4
  Filled 2013-12-01: qty 2
  Filled 2013-12-01 (×7): qty 4
  Filled 2013-12-01: qty 2
  Filled 2013-12-01: qty 4
  Filled 2013-12-01: qty 2
  Filled 2013-12-01 (×3): qty 4
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
## 2013-12-01 NOTE — Progress Notes (Signed)
Called about elevated BP.  Reordered home BP medications.  Marta LamasJames O. Gae BonWyatt, [name], MD, FACS [phone] Trauma Surgeon

## 2013-12-01 NOTE — Clinical Social Work Placement (Addendum)
Clinical Social Work Department CLINICAL SOCIAL WORK PLACEMENT NOTE 12/01/2013  Patient:  [name],[name]  Account Number:  [id number] Admit date:  11/25/2013  Clinical Social Worker:  Macario GoldsJESSE Itzy Adler, LCSW  Date/time:  12/01/2013 12:15 PM  Clinical Social Work is seeking post-discharge placement for this patient at the following level of care:   SKILLED NURSING   (*CSW will update this form in Epic as items are completed)   12/01/2013  Patient/family provided with [facility] Department of Clinical Social Work's list of facilities offering this level of care within the geographic area requested by the patient (or if unable, by the patient's family).  12/01/2013  Patient/family informed of their freedom to choose among providers that offer the needed level of care, that participate in Medicare, Medicaid or managed care program needed by the patient, have an available bed and are willing to accept the patient.  12/01/2013  Patient/family informed of [facility]' ownership interest in [facility], as well as of the fact that they are under no obligation to receive care at this facility.  PASARR submitted to EDS on 11/30/2013 PASARR number received from [employer] on   FL2 transmitted to all facilities in geographic area requested by pt/family on  12/01/2013 FL2 transmitted to all facilities within larger geographic area on   Patient informed that his/her managed care company has contracts with or will negotiate with  certain facilities, including the following:     Patient/family informed of bed offers received:  12/07/2013  Patient [facility] bed [facility]  [facility] Physician recommends and patient chooses bed at    [facility] be [facility]  12/07/2013   [facility]  The following physician request were entered in Epic:   Additional Comments: 04/29 patient family requesting [facility]

## 2013-12-01 NOTE — Progress Notes (Signed)
Dr. Lindie SpruceWyatt updated on pt status. Made aware pt extubated successfully this am. MD made aware pt constantly complaining of pain (bilateral lower extremities and left hip) despite multiple doses of PRN fentanyl. Pt reports little relief with fentanyl doses. Orders received to increase fentanyl dose to 50-150 mcg q1hour. Also made aware pt's BP has remained elevated since extubation. 5mg  IV lopressor given with no relief. Orders to increase lopressor dose to 5-10 mg IV q6hours. Will continue to monitor closely.  Arva ChafeBlaire Helana Macbride, RN
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
## 2013-12-01 NOTE — Progress Notes (Signed)
Follow up - Trauma and Critical Care  Patient Details:    [name] is an 63 y.o. male.  Lines/tubes : Airway 7.5 mm (Active)  Secured at (cm) 21 cm 12/01/2013  3:32 AM  Measured From Lips 12/01/2013  3:32 AM  Secured Location Center 12/01/2013  3:32 AM  Secured By [employer] 12/01/2013  3:32 AM  Tube Holder Repositioned Yes 12/01/2013  3:32 AM  Cuff Pressure (cm H2O) 24 cm H2O 11/30/2013  7:53 PM  Site Condition Dry 12/01/2013  3:32 AM     CVC Double Lumen 11/29/13 Left Subclavian 16 cm (Active)  Indication for Insertion or Continuance of Line Prolonged intravenous therapies 11/30/2013  8:00 PM  Site Assessment Clean;Dry;Intact 11/30/2013  8:00 PM  Proximal Lumen Status Infusing 11/30/2013  8:00 PM  Distal Lumen Status Infusing 11/30/2013  8:00 PM  Dressing Type Transparent;Occlusive 11/30/2013  8:00 PM  Dressing Status Clean;Dry;Intact;Antimicrobial disc in place 11/30/2013  8:00 PM  Dressing Intervention New dressing 11/30/2013 12:00 AM  Dressing Change Due 12/06/13 11/30/2013  8:00 AM     NG/OG Tube Orogastric 14 Fr. (Active)  Placement Verification Auscultation 11/30/2013  8:00 PM  Site Assessment Dry;Clean;Intact 11/30/2013  8:00 PM  Status Suction-low intermittent 11/30/2013  8:00 PM  Drainage Appearance Bile;Brown 11/30/2013  8:00 PM  Intake (mL) 30 mL 12/01/2013  5:00 AM  Output (mL) 250 mL 12/01/2013  4:00 AM     Urethral Catheter [name] Guard, [name] Latex 14 Fr. (Active)  Indication for Insertion or Continuance of Catheter Peri-operative use for selective surgical procedure 11/30/2013  8:00 PM  Site Assessment Clean;Intact 11/30/2013  8:00 PM  Catheter Maintenance Bag below level of bladder;Catheter secured;Drainage bag/tubing not touching floor;Insertion date on drainage bag;No dependent loops;Seal intact 11/30/2013  8:00 PM  Collection Container Standard drainage bag 11/30/2013  8:00 PM  Securement Method Leg strap 11/30/2013  8:00 PM  Urinary Catheter Interventions  Unclamped 11/30/2013  8:00 PM  Output (mL) 35 mL 12/01/2013  6:00 AM    Microbiology/Sepsis markers: Results for orders placed during the hospital encounter of 11/25/13  SURGICAL PCR SCREEN     Status: None   Collection Time    11/26/13  4:12 AM      Result Value Ref Range Status   MRSA, PCR NEGATIVE  NEGATIVE Final   Staphylococcus aureus NEGATIVE  NEGATIVE Final   Comment:            The Xpert SA Assay (FDA     approved for NASAL specimens     in patients over 73 years of age),     is one component of     a comprehensive surveillance     program.  Test performance has     been validated by [employer] for patients greater     than or equal to 49 year old.     It is not intended     to diagnose infection nor to     guide or monitor treatment.  MRSA PCR SCREENING     Status: None   Collection Time    11/29/13  3:57 AM      Result Value Ref Range Status   MRSA by PCR NEGATIVE  NEGATIVE Final   Comment:            The GeneXpert MRSA Assay (FDA     approved for NASAL specimens     only), is one component of a  comprehensive MRSA colonization     surveillance program. It is not     intended to diagnose MRSA     infection nor to guide or     monitor treatment for     MRSA infections.    Anti-infectives:  Anti-infectives   Start     Dose/Rate Route Frequency Ordered Stop   11/29/13 0600  ceFAZolin (ANCEF) IVPB 2 g/50 mL premix     2 g 100 mL/hr over 30 Minutes Intravenous On call to O.R. 11/29/13 0111 11/29/13 [id number]      Best Practice/Protocols:  VTE Prophylaxis: Lovenox (prophylaxtic dose) GI Prophylaxis: Proton Pump Inhibitor Continous Sedation Propofol  Consults: Treatment Team:  [name] PalmerMichael H Handy, [name] [name] RibasKevin R Kuzma, [name]    Events:  Subjective:    Overnight Issues: No issues overnight  Objective:  Vital signs for last 24 hours: Temp:  [98.2 F (36.8 C)-102.6 F (39.2 C)] 98.9 F (37.2 C) (04/29 0733) Pulse Rate:  [78-120] 86 (04/29 0600) Resp:   [14-30] 15 (04/29 0600) BP: (132-198)/(68-102) 166/79 mmHg (04/29 0600) SpO2:  [92 %-100 %] 97 % (04/29 0600) FiO2 (%):  [40 %] 40 % (04/29 0332) Weight:  [105.7 kg (233 lb 0.4 oz)] 105.7 kg (233 lb 0.4 oz) (04/29 0500)  Hemodynamic parameters for last 24 hours:    Intake/Output from previous day: 04/28 0701 - 04/29 0700 In: 2949.3 [I.V.:2889.3; NG/GT:60] Out: 2880 [Urine:1950; Emesis/NG output:930]  Intake/Output this shift:    Vent settings for last 24 hours: Vent Mode:  [-] PRVC FiO2 (%):  [40 %] 40 % Set Rate:  [14 bmp] 14 bmp Vt Set:  [630 mL] 630 mL PEEP:  [5 cmH20] 5 cmH20 Pressure Support:  [10 cmH20-12 cmH20] 12 cmH20 Plateau Pressure:  [17 cmH20-19 cmH20] 17 cmH20  Physical Exam:  General: alert and no respiratory distress Resp: clear to auscultation bilaterally Extremities: Multiple wrapped extremities  Results for orders placed during the hospital encounter of 11/25/13 (from the past 24 hour(s))  GLUCOSE, CAPILLARY     Status: Abnormal   Collection Time    11/30/13 12:06 PM      Result Value Ref Range   Glucose-Capillary 166 (*) 70 - 99 mg/dL   Comment 1 Documented in Chart     Comment 2 Notify [name]    GLUCOSE, CAPILLARY     Status: Abnormal   Collection Time    11/30/13  3:54 PM      Result Value Ref Range   Glucose-Capillary 156 (*) 70 - 99 mg/dL   Comment 1 Documented in Chart     Comment 2 Notify [name]    GLUCOSE, CAPILLARY     Status: Abnormal   Collection Time    11/30/13  7:38 PM      Result Value Ref Range   Glucose-Capillary 153 (*) 70 - 99 mg/dL   Comment 1 Documented in Chart     Comment 2 Notify [name]    GLUCOSE, CAPILLARY     Status: Abnormal   Collection Time    11/30/13 11:30 PM      Result Value Ref Range   Glucose-Capillary 148 (*) 70 - 99 mg/dL   Comment 1 Documented in Chart     Comment 2 Notify [name]    GLUCOSE, CAPILLARY     Status: Abnormal   Collection Time    12/01/13  3:52 AM      Result Value Ref Range   Glucose-Capillary 155  (*) 70 - 99 mg/dL  Comment 1 Documented in Chart     Comment 2 Notify [name]    CBC     Status: Abnormal   Collection Time    12/01/13  4:00 AM      Result Value Ref Range   WBC 10.3  4.0 - 10.5 K/uL   RBC 3.44 (*) 4.22 - 5.81 MIL/uL   Hemoglobin 9.4 (*) 13.0 - 17.0 g/dL   HCT 16.128.9 (*) 09.639.0 - 04.552.0 %   MCV 84.0  78.0 - 100.0 fL   MCH 27.3  26.0 - 34.0 pg   MCHC 32.5  30.0 - 36.0 g/dL   RDW 40.917.1 (*) 81.111.5 - 91.415.5 %   Platelets 220  150 - 400 K/uL  BASIC METABOLIC PANEL     Status: Abnormal   Collection Time    12/01/13  4:00 AM      Result Value Ref Range   Sodium 143  137 - 147 mEq/L   Potassium 3.8  3.7 - 5.3 mEq/L   Chloride 103  96 - 112 mEq/L   CO2 28  19 - 32 mEq/L   Glucose, Bld 161 (*) 70 - 99 mg/dL   BUN 15  6 - 23 mg/dL   Creatinine, Ser 7.820.62  0.50 - 1.35 mg/dL   Calcium 7.8 (*) 8.4 - 10.5 mg/dL   GFR calc non Af Amer >90  >90 mL/min   GFR calc Af Amer >90  >90 mL/min     Assessment/Plan:   NEURO  Altered Mental Status:  sedation   Plan: Will wean sedation as we move towards extubation  PULM  Atelectasis/collapse (focal and RLL)   Plan: Will try to extubate today.  CARDIO  No significant issues   Plan: CPM  RENAL  Urine output is adequate.  May need more Lasix   Plan: CPM, possible Lasix  GI  No issues   Plan: Start on clear liquids when extubated  ID  No known infectious sources   Plan: CPM  HEME  Anemia acute blood loss anemia)   Plan: CPM, No blood for now.  ENDO No specific issues   Plan: CPM  Global Issues  Patient weaning for extubation.  Seems very comfortable.  Once extubated, [facility].      LOS: 6 days   Additional comments:I reviewed the patient's new clinical lab test results. cbc/bmet and I reviewed the patients new imaging test results. cxr  Critical Care Total Time*: 30 Minutes  [name] 12/01/2013  *Care during the described time interval was provided by me and/or other providers on the critical  care team.  I have reviewed this patient's available data, including medical history, events of note, physical examination and test results as part of my evaluation.

## 2013-12-01 NOTE — Clinical Social Work Note (Signed)
Clinical Social Worker continuing to follow patient and family for support and discharge planning needs.  CSW spoke with patient brother while patient remained intubated.  Patient brother states that patient family is hopeful for [facility].  CSW has completed FL2 and initiated referral.  CSW to send updated therapy notes per facility request.  CSW to follow up with patient and patient family regarding potential bed offer.  CSW remains available for support and to facilitate patient discharge planning needs.  Macario GoldsJesse Beverlie Kurihara, [place] [phone]
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
## 2013-12-02 ENCOUNTER — Ambulatory Visit
Admit: 2013-12-02 | Discharge: 2013-12-02 | Disposition: A | Discharge status: Home / Self Care | Payer: Medicare Other | Attending: Radiation Oncology | Admitting: Radiation Oncology

## 2013-12-02 ENCOUNTER — Other Ambulatory Visit: Payer: Medicare Other | Admitting: Radiation Oncology

## 2013-12-02 VITALS — BP 211/98 | HR 93 | Temp 98.7°F

## 2013-12-02 DIAGNOSIS — S82142A Displaced bicondylar fracture of left tibia, initial encounter for closed fracture: Secondary | ICD-10-CM

## 2013-12-02 DIAGNOSIS — S32402A Unspecified fracture of left acetabulum, initial encounter for closed fracture: Secondary | ICD-10-CM

## 2013-12-02 DIAGNOSIS — X58XXXA Exposure to other specified factors, initial encounter: Secondary | ICD-10-CM | POA: Diagnosis not present

## 2013-12-02 DIAGNOSIS — S32409A Unspecified fracture of unspecified acetabulum, initial encounter for closed fracture: Secondary | ICD-10-CM | POA: Insufficient documentation

## 2013-12-02 LAB — CBC WITH DIFFERENTIAL/PLATELET
BASOS PCT: 0 % (ref 0–1)
Basophils Absolute: 0 10*3/uL (ref 0.0–0.1)
EOS ABS: 0.2 10*3/uL (ref 0.0–0.7)
EOS PCT: 2 % (ref 0–5)
HCT: 28.2 % — ABNORMAL LOW (ref 39.0–52.0)
Hemoglobin: 8.9 g/dL — ABNORMAL LOW (ref 13.0–17.0)
LYMPHS ABS: 2 10*3/uL (ref 0.7–4.0)
Lymphocytes Relative: 18 % (ref 12–46)
MCH: 27 pg (ref 26.0–34.0)
MCHC: 31.6 g/dL (ref 30.0–36.0)
MCV: 85.5 fL (ref 78.0–100.0)
Monocytes Absolute: 0.9 10*3/uL (ref 0.1–1.0)
Monocytes Relative: 9 % (ref 3–12)
NEUTROS PCT: 71 % (ref 43–77)
Neutro Abs: 7.6 10*3/uL (ref 1.7–7.7)
Platelets: 218 10*3/uL (ref 150–400)
RBC: 3.3 MIL/uL — AB (ref 4.22–5.81)
RDW: 17.3 % — ABNORMAL HIGH (ref 11.5–15.5)
WBC: 10.6 10*3/uL — ABNORMAL HIGH (ref 4.0–10.5)

## 2013-12-02 LAB — GLUCOSE, CAPILLARY
GLUCOSE-CAPILLARY: 111 mg/dL — AB (ref 70–99)
GLUCOSE-CAPILLARY: 114 mg/dL — AB (ref 70–99)
GLUCOSE-CAPILLARY: 138 mg/dL — AB (ref 70–99)
Glucose-Capillary: 108 mg/dL — ABNORMAL HIGH (ref 70–99)
Glucose-Capillary: 120 mg/dL — ABNORMAL HIGH (ref 70–99)
Glucose-Capillary: 124 mg/dL — ABNORMAL HIGH (ref 70–99)
Glucose-Capillary: 159 mg/dL — ABNORMAL HIGH (ref 70–99)

## 2013-12-02 LAB — BASIC METABOLIC PANEL
BUN: 13 mg/dL (ref 6–23)
CALCIUM: 7.9 mg/dL — AB (ref 8.4–10.5)
CO2: 27 mEq/L (ref 19–32)
Chloride: 101 mEq/L (ref 96–112)
Creatinine, Ser: 0.62 mg/dL (ref 0.50–1.35)
GLUCOSE: 136 mg/dL — AB (ref 70–99)
POTASSIUM: 3.8 meq/L (ref 3.7–5.3)
Sodium: 138 mEq/L (ref 137–147)

## 2013-12-02 MED ORDER — HYDROMORPHONE HCL PF 4 MG/ML IJ SOLN
3.0000 mg | Freq: Once | INTRAMUSCULAR | Status: AC
Start: 2013-12-02 — End: 2013-12-02
  Administered 2013-12-02: 3 mg via INTRAVENOUS
  Filled 2013-12-02: qty 1

## 2013-12-02 MED ORDER — BOOST / RESOURCE BREEZE PO LIQD
1.0000 | Freq: Three times a day (TID) | ORAL | Status: DC
  Administered 2013-12-02 – 2013-12-06 (×12): 1 via ORAL

## 2013-12-02 MED ORDER — HYDROMORPHONE HCL PF 4 MG/ML IJ SOLN
1.0000 mg | Freq: Once | INTRAMUSCULAR | Status: AC
  Administered 2013-12-02: 1 mg via INTRAVENOUS
  Filled 2013-12-02: qty 1

## 2013-12-02 NOTE — Progress Notes (Signed)
[facility].  Will check patient on his return.  BP seems better now that the patient is on his home BP medications.  Marta LamasJames O. Gae BonWyatt, [name], MD, FACS [phone] Trauma Surgeon

## 2013-12-02 NOTE — Progress Notes (Signed)
  Radiation Oncology         [phone] ________________________________  Name: [name] MRN: [id number]  Date: 12/02/2013  DOB: June 11, 1951  End of Treatment Note  Diagnosis:   Left acetabular fracture (trauma)     Indication for treatment:  Prevention of heterotopic ossification       Radiation treatment dates:   12/02/13  Site/dose:   Left hip/ 7 Gy in 1 fractions  Beams/energy:   AP/PA with 10 MV photons  Narrative: The patient tolerated radiation treatment relatively well.   [facility]   ------------------------------------------------  Lurline HareStacy Jency Schnieders, MD

## 2013-12-02 NOTE — Progress Notes (Signed)
OT Cancellation Note  Patient Details Name: Hoy Mornhomas Cravey MRN: [id number] DOB: 06-06-1951   Cancelled Treatment:    Reason Eval/Treat Not Completed: Patient at procedure or test/ unavailable (@ [facility] off campus)  Harolyn RutherfordJessica B Wyoma Genson Pager: [phone]  12/02/2013, 9:48 AM

## 2013-12-02 NOTE — Addendum Note (Signed)
Encounter addended by: Richardean SaleBrandy Lynn Shloma Roggenkamp, [facility] on: 12/02/2013 12:19 PM<BR>     Documentation filed: Rx Order Verification

## 2013-12-02 NOTE — Progress Notes (Signed)
Orthopaedic Trauma Service Progress note  [facility] HO prophylaxis  Will see tomorrow  Please call with questions   Mearl LatinKeith W. Reni Hausner, PA-C [facility] [phone] (P) 12/02/2013 10:14 AM

## 2013-12-02 NOTE — Addendum Note (Signed)
Encounter addended by: Tessa LernerValantrice Marie Nadeem Romanoski, RN on: 12/02/2013 12:16 PM<BR>     Documentation filed: Inpatient MAR

## 2013-12-02 NOTE — Progress Notes (Signed)
Patient reports generalized pain 12 on a scale of 0-10. Confirmed with [facility] Nurse, Peggy, that patient received Fentanyl 150 mcg at 0730. Blood pressure elevated. Administered Dilaudid 3 mg IV over three minutes as ordered by Dr. Michell HeinrichWentworth. Blood pressure improved. Patient verbalized relief. Patient understands his treatment is scheduled for 1100 and will press call light for needs.

## 2013-12-02 NOTE — Progress Notes (Signed)
Increase in pain after the completion of treatment.Given 1 mg dilaudid iv at 12:20 pm in [facility].Back and leg pain level "6".
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
## 2013-12-02 NOTE — Addendum Note (Signed)
Encounter addended by: Tessa LernerValantrice Marie Allanna Bresee, RN on: 12/02/2013 12:40 PM<BR>     Documentation filed: Notes Section, Vitals Section

## 2013-12-02 NOTE — Progress Notes (Addendum)
Name: [name]   MRN: [id number]  Date:  12/02/2013  DOB: 07/19/1951  Status:inpatient    DIAGNOSIS: Left acetabular fracture  CONSENT VERIFIED: yes   SET UP: Patient is setup supine   IMMOBILIZATION:  The following immobilization was used: vacloc  NARRATIVE:  Pt [name].  Identity was confirmed.  All relevant records and images related to the planned course of therapy were reviewed.  Then, the patient was positioned in a stable reproducible clinical set-up for radiation therapy.  CT images were obtained.  An isocenter was placed. Skin markings were placed.  The CT images were loaded into the planning software where the target and avoidance structures were contoured.  The radiation prescription was entered and confirmed. The patient was discharged in stable condition and tolerated simulation well.    TREATMENT PLANNING NOTE:  Treatment planning then occurred. I have requested : MLC's, isodose plan, basic dose calculation  A total of 1 complex treatment device was used.

## 2013-12-03 DIAGNOSIS — I10 Essential (primary) hypertension: Secondary | ICD-10-CM

## 2013-12-03 LAB — CBC
HCT: 32.7 % — ABNORMAL LOW (ref 39.0–52.0)
Hemoglobin: 10.4 g/dL — ABNORMAL LOW (ref 13.0–17.0)
MCH: 27.2 pg (ref 26.0–34.0)
MCHC: 31.8 g/dL (ref 30.0–36.0)
MCV: 85.4 fL (ref 78.0–100.0)
Platelets: 250 10*3/uL (ref 150–400)
RBC: 3.83 MIL/uL — ABNORMAL LOW (ref 4.22–5.81)
RDW: 16.7 % — ABNORMAL HIGH (ref 11.5–15.5)
WBC: 10.3 10*3/uL (ref 4.0–10.5)

## 2013-12-03 LAB — GLUCOSE, CAPILLARY
GLUCOSE-CAPILLARY: 196 mg/dL — AB (ref 70–99)
GLUCOSE-CAPILLARY: 131 mg/dL — AB (ref 70–99)
Glucose-Capillary: 130 mg/dL — ABNORMAL HIGH (ref 70–99)
Glucose-Capillary: 120 mg/dL — ABNORMAL HIGH (ref 70–99)
Glucose-Capillary: 130 mg/dL — ABNORMAL HIGH (ref 70–99)

## 2013-12-03 MED ORDER — HYDROMORPHONE HCL PF 1 MG/ML IJ SOLN
0.5000 mg | INTRAMUSCULAR | Status: DC | PRN
  Administered 2013-12-03 – 2013-12-04 (×6): 1 mg via INTRAVENOUS
  Filled 2013-12-03 (×6): qty 1

## 2013-12-03 MED ORDER — METOPROLOL TARTRATE 50 MG PO TABS
50.0000 mg | ORAL_TABLET | Freq: Two times a day (BID) | ORAL | Status: DC
  Administered 2013-12-03 – 2013-12-07 (×9): 50 mg via ORAL
  Filled 2013-12-03 (×10): qty 1

## 2013-12-03 MED ORDER — METFORMIN HCL ER 500 MG PO TB24
500.0000 mg | ORAL_TABLET | Freq: Every day | ORAL | Status: DC
Start: 2013-12-03 — End: 2013-12-07
  Administered 2013-12-03 – 2013-12-07 (×5): 500 mg via ORAL
  Filled 2013-12-03 (×5): qty 1

## 2013-12-03 MED ORDER — OXYCODONE HCL 5 MG PO TABS
5.0000 mg | ORAL_TABLET | ORAL | Status: DC | PRN
  Administered 2013-12-03 – 2013-12-04 (×6): 10 mg via ORAL
  Filled 2013-12-03 (×6): qty 2

## 2013-12-03 NOTE — Progress Notes (Signed)
Subjective: 4 Days Post-Op Procedure(s) (LRB): OPEN REDUCTION INTERNAL FIXATION (ORIF) RIGHT DISTAL RADIAL FRACTURE (Right) OPEN REDUCTION INTERNAL FIXATION (ORIF) TIBIAL PLATEAU (Left) OPEN REDUCTION INTERNAL FIXATION (ORIF) PILON FRACTURE (Right) OPEN REDUCTION INTERNAL FIXATION (ORIF) ACETABULAR FRACTURE (Left) Patient reports pain as none.    Objective: Vital signs in last 24 hours: Temp:  [97.9 F (36.6 C)-99 F (37.2 C)] 98.8 F (37.1 C) (05/01 1139) Pulse Rate:  [81-99] 92 (05/01 1400) Resp:  [16-28] 19 (05/01 1400) BP: (135-194)/(64-167) 158/77 mmHg (05/01 1400) SpO2:  [90 %-99 %] 94 % (05/01 1400)  Intake/Output from previous day: 04/30 0701 - 05/01 0700 In: 1670 [P.O.:720; I.V.:950] Out: 2330 [Urine:2330] Intake/Output this shift: Total I/O In: 910 [P.O.:840; I.V.:70] Out: -    Recent Labs  12/01/13 0400 12/02/13 0420 12/03/13 0924  HGB 9.4* 8.9* 10.4*    Recent Labs  12/02/13 0420 12/03/13 0924  WBC 10.6* 10.3  RBC 3.30* 3.83*  HCT 28.2* 32.7*  PLT 218 250    Recent Labs  12/01/13 0400 12/02/13 0420  NA 143 138  K 3.8 3.8  CL 103 101  CO2 28 27  BUN 15 13  CREATININE 0.62 0.62  GLUCOSE 161* 136*  CALCIUM 7.8* 7.9*   No results found for this basename: LABPT, INR,  in the last 72 hours  intact sensation and capillary refill all digits.  +epl/fpl/io.  dressing clean dry intact.  minimal swelling in digits.  dressing not tight.  Assessment/Plan: 4 Days Post-Op Procedure(s) (LRB): OPEN REDUCTION INTERNAL FIXATION (ORIF) RIGHT DISTAL RADIAL FRACTURE (Right) OPEN REDUCTION INTERNAL FIXATION (ORIF) TIBIAL PLATEAU (Left) OPEN REDUCTION INTERNAL FIXATION (ORIF) PILON FRACTURE (Right) OPEN REDUCTION INTERNAL FIXATION (ORIF) ACETABULAR FRACTURE (Left) Will start gentle rom right wrist next week.  Tami RibasKevin R Nahsir Venezia 12/03/2013, 2:58 PM
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
## 2013-12-03 NOTE — Progress Notes (Signed)
Orthopedic Tech Progress Note Patient Details:  [name] [birth year] [id number] Nurse called for splint but upon checking order realized that order was put in for Occupational Therapist splint with ROM activities. Called nurse for clarification. Nurse to consult with ordering doctor to see if OT is indeed supposed to be fitting patient with splint. Ortho Tech on stand bye.   Patient ID: [name] Mornhomas [place], male   DOB: [birth year], 63 y.o.   MRN: [id number]   [name] 12/03/2013, 4:10 PM

## 2013-12-03 NOTE — Progress Notes (Signed)
Trauma Service Note  Subjective: Patient is awake and alert.  BP is still high.  Has not been out of bed.  May be able to transfer only.  Objective: Vital signs in last 24 hours: Temp:  [97.9 F (36.6 C)-99 F (37.2 C)] 98.2 F (36.8 C) (05/01 0730) Pulse Rate:  [86-101] 89 (05/01 0700) Resp:  [14-28] 19 (05/01 0700) BP: (146-211)/(64-167) 178/84 mmHg (05/01 0700) SpO2:  [92 %-100 %] 95 % (05/01 0700) Last BM Date: 12/03/13  Intake/Output from previous day: 04/30 0701 - 05/01 0700 In: 1670 [P.O.:720; I.V.:950] Out: 2330 [Urine:2330] Intake/Output this shift:    General: No acute distress.  Says his chest hurts.  Lungs: Clear  Abd: Soft, good bowel sounds, nontender  Extremities: Wrapped, no SCDs.  Neuro: Intact  Lab Results: CBC   Recent Labs  12/01/13 0400 12/02/13 0420  WBC 10.3 10.6*  HGB 9.4* 8.9*  HCT 28.9* 28.2*  PLT 220 218   BMET  Recent Labs  12/01/13 0400 12/02/13 0420  NA 143 138  K 3.8 3.8  CL 103 101  CO2 28 27  GLUCOSE 161* 136*  BUN 15 13  CREATININE 0.62 0.62  CALCIUM 7.8* 7.9*   PT/INR No results found for this basename: LABPROT, INR,  in the last 72 hours ABG No results found for this basename: PHART, PCO2, PO2, HCO3,  in the last 72 hours  Studies/Results: No results found.  Anti-infectives: Anti-infectives   Start     Dose/Rate Route Frequency Ordered Stop   11/29/13 0600  ceFAZolin (ANCEF) IVPB 2 g/50 mL premix     2 g 100 mL/hr over 30 Minutes Intravenous On call to O.R. 11/29/13 0111 11/29/13 [id number]      Assessment/Plan: s/p Procedure(s): OPEN REDUCTION INTERNAL FIXATION (ORIF) RIGHT DISTAL RADIAL FRACTURE OPEN REDUCTION INTERNAL FIXATION (ORIF) TIBIAL PLATEAU OPEN REDUCTION INTERNAL FIXATION (ORIF) PILON FRACTURE OPEN REDUCTION INTERNAL FIXATION (ORIF) ACETABULAR FRACTURE Advance diet Transfer out of the unit.  LOS: 8 days   Marta LamasJames O. Gae BonWyatt, [name], MD, FACS [phone] Trauma Surgeon 12/03/2013
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
## 2013-12-04 LAB — GLUCOSE, CAPILLARY
GLUCOSE-CAPILLARY: 149 mg/dL — AB (ref 70–99)
Glucose-Capillary: 135 mg/dL — ABNORMAL HIGH (ref 70–99)
Glucose-Capillary: 122 mg/dL — ABNORMAL HIGH (ref 70–99)
Glucose-Capillary: 129 mg/dL — ABNORMAL HIGH (ref 70–99)
Glucose-Capillary: 151 mg/dL — ABNORMAL HIGH (ref 70–99)
Glucose-Capillary: 145 mg/dL — ABNORMAL HIGH (ref 70–99)

## 2013-12-04 MED ORDER — HYDROMORPHONE HCL PF 1 MG/ML IJ SOLN
0.5000 mg | INTRAMUSCULAR | Status: DC | PRN
Start: 2013-12-04 — End: 2013-12-05
  Filled 2013-12-04: qty 1

## 2013-12-04 MED ORDER — OXYCODONE HCL 5 MG PO TABS
10.0000 mg | ORAL_TABLET | ORAL | Status: DC | PRN
  Administered 2013-12-04 – 2013-12-07 (×18): 20 mg via ORAL
  Filled 2013-12-04 (×19): qty 4

## 2013-12-04 MED ORDER — POLYETHYLENE GLYCOL 3350 17 G PO PACK
17.0000 g | PACK | Freq: Every day | ORAL | Status: DC
  Administered 2013-12-04 – 2013-12-07 (×4): 17 g via ORAL
  Filled 2013-12-04 (×4): qty 1

## 2013-12-04 MED ORDER — DOCUSATE SODIUM 100 MG PO CAPS
100.0000 mg | ORAL_CAPSULE | Freq: Two times a day (BID) | ORAL | Status: DC
  Administered 2013-12-04 – 2013-12-07 (×7): 100 mg via ORAL
  Filled 2013-12-04 (×7): qty 1

## 2013-12-04 MED ORDER — ENOXAPARIN SODIUM 30 MG/0.3ML ~~LOC~~ SOLN
30.0000 mg | Freq: Two times a day (BID) | SUBCUTANEOUS | Status: DC
  Administered 2013-12-04 – 2013-12-07 (×7): 30 mg via SUBCUTANEOUS
  Filled 2013-12-04 (×8): qty 0.3
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
## 2013-12-04 NOTE — Progress Notes (Signed)
Orthopedic Tech Progress Note Patient Details:  [name] 06/28/1951 [id number]  Patient ID: [name], male   DOB: 06/28/1951, 63 y.o.   MRN: [id number]   Mickie BailJennifer Carol Cammer 12/04/2013, 2:09 PMFoot roll

## 2013-12-04 NOTE — Progress Notes (Signed)
I have seen and examined the pt and agree with PA-Jeffery's progress note.  

## 2013-12-04 NOTE — Progress Notes (Signed)
Patient transferred to 5N-30 at 1130 in bed by nurse and accompanied by sister. Patient's belongings were with them. No complaints of pain, BP 159/72, HR 94, RR 18, O2 97 on room air. Report given to Titus Dubinanya Beal, RN
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
## 2013-12-05 LAB — GLUCOSE, CAPILLARY
GLUCOSE-CAPILLARY: 133 mg/dL — AB (ref 70–99)
GLUCOSE-CAPILLARY: 122 mg/dL — AB (ref 70–99)
GLUCOSE-CAPILLARY: 125 mg/dL — AB (ref 70–99)
Glucose-Capillary: 133 mg/dL — ABNORMAL HIGH (ref 70–99)
Glucose-Capillary: 140 mg/dL — ABNORMAL HIGH (ref 70–99)
Glucose-Capillary: 145 mg/dL — ABNORMAL HIGH (ref 70–99)

## 2013-12-05 MED ORDER — WARFARIN - PHARMACIST DOSING INPATIENT: Freq: Every day | Status: DC

## 2013-12-05 MED ORDER — WARFARIN SODIUM 7.5 MG PO TABS
7.5000 mg | ORAL_TABLET | Freq: Once | ORAL | Status: AC
  Administered 2013-12-05: 7.5 mg via ORAL
  Filled 2013-12-05: qty 1

## 2013-12-05 MED ORDER — WARFARIN VIDEO: Freq: Once | Status: DC

## 2013-12-05 MED ORDER — COUMADIN BOOK
Freq: Once | Status: AC
  Administered 2013-12-05: 12:00:00
  Filled 2013-12-05: qty 1
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
## 2013-12-05 NOTE — Progress Notes (Signed)
ATTENDING ADDENDUM:  I personally reviewed patient's record, examined the patient, and formulated the following assessment and plan:  Agree with PA Jeffery's progress note
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
## 2013-12-06 LAB — CREATININE, SERUM
Creatinine, Ser: 0.72 mg/dL (ref 0.50–1.35)
GFR calc Af Amer: >90 mL/min (ref >90)
GFR calc non Af Amer: >90 mL/min (ref >90)

## 2013-12-06 LAB — GLUCOSE, CAPILLARY
GLUCOSE-CAPILLARY: 121 mg/dL — AB (ref 70–99)
GLUCOSE-CAPILLARY: 160 mg/dL — AB (ref 70–99)
GLUCOSE-CAPILLARY: 164 mg/dL — AB (ref 70–99)
Glucose-Capillary: 128 mg/dL — ABNORMAL HIGH (ref 70–99)
Glucose-Capillary: 145 mg/dL — ABNORMAL HIGH (ref 70–99)
Glucose-Capillary: 156 mg/dL — ABNORMAL HIGH (ref 70–99)

## 2013-12-06 LAB — PROTIME-INR
INR: 1.04 (ref 0.00–1.49)
Prothrombin Time: 13.4 s (ref 11.6–15.2)

## 2013-12-06 MED ORDER — WARFARIN SODIUM 7.5 MG PO TABS
7.5000 mg | ORAL_TABLET | Freq: Once | ORAL | Status: AC
  Administered 2013-12-06: 7.5 mg via ORAL
  Filled 2013-12-06 (×2): qty 1
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
## 2013-12-06 NOTE — Clinical Social Work Note (Signed)
Clinical Social Worker continuing to follow patient and family for support and discharge planning needs.  PT/OT recommending SNF - CSW has initiated bed search, with preference to [facility], however no bed availability at this time.  CSW spoke with patient at bedside who states that he would like for CSW to communicate with his niece, Ulyess Blossomrimela, regarding additional rehab options.  Patient [facility]  [facility] [place].  CSW to follow up with patient and patient family regarding bed offers once available.  CSW remains available for support and to facilitate patient discharge needs once medically ready.  Macario GoldsJesse Nester [place], [place] [phone]
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
## 2013-12-07 LAB — GLUCOSE, CAPILLARY
GLUCOSE-CAPILLARY: 102 mg/dL — AB (ref 70–99)
GLUCOSE-CAPILLARY: 138 mg/dL — AB (ref 70–99)
Glucose-Capillary: 118 mg/dL — ABNORMAL HIGH (ref 70–99)
Glucose-Capillary: 160 mg/dL — ABNORMAL HIGH (ref 70–99)
Glucose-Capillary: 108 mg/dL — ABNORMAL HIGH (ref 70–99)

## 2013-12-07 LAB — PROTIME-INR
INR: 1.32 (ref 0.00–1.49)
Prothrombin Time: 16.1 s — ABNORMAL HIGH (ref 11.6–15.2)

## 2013-12-07 MED ORDER — ENOXAPARIN SODIUM 30 MG/0.3ML ~~LOC~~ SOLN: 30.0000 mg | Freq: Two times a day (BID) | SUBCUTANEOUS | Status: DC

## 2013-12-07 MED ORDER — ONDANSETRON HCL 4 MG PO TABS
4.0000 mg | ORAL_TABLET | Freq: Four times a day (QID) | ORAL | Status: DC | PRN
  Administered 2013-12-07: 8 mg via ORAL

## 2013-12-07 MED ORDER — OXYCODONE HCL 10 MG PO TABS: 10.0000 mg | ORAL_TABLET | ORAL | Status: DC | PRN

## 2013-12-07 MED ORDER — WARFARIN SODIUM 5 MG PO TABS: 5.0000 mg | ORAL_TABLET | Freq: Once | ORAL | Status: DC

## 2013-12-07 MED ORDER — WARFARIN SODIUM 7.5 MG PO TABS
7.5000 mg | ORAL_TABLET | Freq: Once | ORAL | Status: AC
  Administered 2013-12-07: 7.5 mg via ORAL
  Filled 2013-12-07: qty 1
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
## 2013-12-07 NOTE — Progress Notes (Signed)
[facility] Alona BeneJoyce  [facility]-emergent [facility].

## 2013-12-07 NOTE — Progress Notes (Signed)
Awaiting placement  This patient has been seen and I agree with the findings and treatment plan.  Mikena Masoner O. Andriea Hasegawa, [name], MD, FACS [phone] (pager) [phone] (direct pager) Trauma Surgeon 

## 2013-12-07 NOTE — Clinical Social Work Note (Signed)
Clinical Social Worker facilitated patient discharge including contacting patient family and facility to confirm patient discharge plans.  Clinical information faxed to facility and family agreeable with plan.  CSW arranged ambulance transport via [facility] to [facility].  RN to call report prior to discharge.  Clinical Social Worker will sign off for now as social work intervention is no longer needed. Please consult us again if new need arises.  Jesse Corrina Steffensen, LCSW [phone] 
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
## 2013-12-22 NOTE — Consult Note (Signed)
I saw and examined the patient with Mr. Paul, communicating the findings and plan noted above.  Khaylee Mcevoy, MD [facility] [phone] (p)  
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
## 2014-08-07 DIAGNOSIS — S0291XD Unspecified fracture of skull, subsequent encounter for fracture with routine healing: Secondary | ICD-10-CM | POA: Diagnosis not present

## 2014-08-07 DIAGNOSIS — S42009A Fracture of unspecified part of unspecified clavicle, initial encounter for closed fracture: Secondary | ICD-10-CM | POA: Diagnosis not present

## 2014-08-08 DIAGNOSIS — S32462D Displaced associated transverse-posterior fracture of left acetabulum, subsequent encounter for fracture with routine healing: Secondary | ICD-10-CM | POA: Diagnosis not present

## 2014-08-08 DIAGNOSIS — M6281 Muscle weakness (generalized): Secondary | ICD-10-CM | POA: Diagnosis not present

## 2014-08-08 DIAGNOSIS — S82871D Displaced pilon fracture of right tibia, subsequent encounter for closed fracture with routine healing: Secondary | ICD-10-CM | POA: Diagnosis not present

## 2014-08-08 DIAGNOSIS — S82142D Displaced bicondylar fracture of left tibia, subsequent encounter for closed fracture with routine healing: Secondary | ICD-10-CM | POA: Diagnosis not present

## 2014-08-17 DIAGNOSIS — E785 Hyperlipidemia, unspecified: Secondary | ICD-10-CM | POA: Diagnosis not present

## 2014-08-17 DIAGNOSIS — I429 Cardiomyopathy, unspecified: Secondary | ICD-10-CM | POA: Diagnosis not present

## 2014-08-17 DIAGNOSIS — I10 Essential (primary) hypertension: Secondary | ICD-10-CM | POA: Diagnosis not present

## 2014-08-17 DIAGNOSIS — Z951 Presence of aortocoronary bypass graft: Secondary | ICD-10-CM | POA: Diagnosis not present

## 2014-08-31 DIAGNOSIS — I259 Chronic ischemic heart disease, unspecified: Secondary | ICD-10-CM | POA: Diagnosis not present

## 2014-08-31 DIAGNOSIS — I429 Cardiomyopathy, unspecified: Secondary | ICD-10-CM | POA: Diagnosis not present

## 2014-08-31 DIAGNOSIS — I10 Essential (primary) hypertension: Secondary | ICD-10-CM | POA: Diagnosis not present

## 2014-09-07 DIAGNOSIS — S0291XD Unspecified fracture of skull, subsequent encounter for fracture with routine healing: Secondary | ICD-10-CM | POA: Diagnosis not present

## 2014-09-07 DIAGNOSIS — S42009A Fracture of unspecified part of unspecified clavicle, initial encounter for closed fracture: Secondary | ICD-10-CM | POA: Diagnosis not present

## 2014-09-13 DIAGNOSIS — Z951 Presence of aortocoronary bypass graft: Secondary | ICD-10-CM | POA: Diagnosis not present

## 2014-09-13 DIAGNOSIS — I429 Cardiomyopathy, unspecified: Secondary | ICD-10-CM | POA: Diagnosis not present

## 2014-09-13 DIAGNOSIS — I10 Essential (primary) hypertension: Secondary | ICD-10-CM | POA: Diagnosis not present

## 2014-09-13 DIAGNOSIS — I259 Chronic ischemic heart disease, unspecified: Secondary | ICD-10-CM | POA: Diagnosis not present

## 2014-09-13 DIAGNOSIS — E785 Hyperlipidemia, unspecified: Secondary | ICD-10-CM | POA: Diagnosis not present

## 2014-09-26 DIAGNOSIS — M47897 Other spondylosis, lumbosacral region: Secondary | ICD-10-CM | POA: Diagnosis not present

## 2014-09-26 DIAGNOSIS — M5137 Other intervertebral disc degeneration, lumbosacral region: Secondary | ICD-10-CM | POA: Diagnosis not present

## 2014-10-06 DIAGNOSIS — I429 Cardiomyopathy, unspecified: Secondary | ICD-10-CM | POA: Diagnosis not present

## 2014-10-06 DIAGNOSIS — E785 Hyperlipidemia, unspecified: Secondary | ICD-10-CM | POA: Diagnosis not present

## 2014-10-06 DIAGNOSIS — I10 Essential (primary) hypertension: Secondary | ICD-10-CM | POA: Diagnosis not present

## 2014-10-06 DIAGNOSIS — E784 Other hyperlipidemia: Secondary | ICD-10-CM | POA: Diagnosis not present

## 2014-10-06 DIAGNOSIS — S42009A Fracture of unspecified part of unspecified clavicle, initial encounter for closed fracture: Secondary | ICD-10-CM | POA: Diagnosis not present

## 2014-10-06 DIAGNOSIS — S0291XD Unspecified fracture of skull, subsequent encounter for fracture with routine healing: Secondary | ICD-10-CM | POA: Diagnosis not present

## 2014-10-24 DIAGNOSIS — M5137 Other intervertebral disc degeneration, lumbosacral region: Secondary | ICD-10-CM | POA: Diagnosis not present

## 2014-10-24 DIAGNOSIS — Z5181 Encounter for therapeutic drug level monitoring: Secondary | ICD-10-CM | POA: Diagnosis not present

## 2014-10-24 DIAGNOSIS — R1310 Dysphagia, unspecified: Secondary | ICD-10-CM | POA: Diagnosis not present

## 2014-10-24 DIAGNOSIS — Z6829 Body mass index (BMI) 29.0-29.9, adult: Secondary | ICD-10-CM | POA: Diagnosis not present

## 2014-10-24 DIAGNOSIS — M533 Sacrococcygeal disorders, not elsewhere classified: Secondary | ICD-10-CM | POA: Diagnosis not present

## 2014-10-24 DIAGNOSIS — M47897 Other spondylosis, lumbosacral region: Secondary | ICD-10-CM | POA: Diagnosis not present

## 2014-10-25 DIAGNOSIS — T18100A Unspecified foreign body in esophagus causing compression of trachea, initial encounter: Secondary | ICD-10-CM | POA: Diagnosis not present

## 2014-10-25 DIAGNOSIS — T18128A Food in esophagus causing other injury, initial encounter: Secondary | ICD-10-CM | POA: Diagnosis not present

## 2014-10-25 DIAGNOSIS — F17210 Nicotine dependence, cigarettes, uncomplicated: Secondary | ICD-10-CM | POA: Diagnosis not present

## 2014-10-25 DIAGNOSIS — E119 Type 2 diabetes mellitus without complications: Secondary | ICD-10-CM | POA: Diagnosis not present

## 2014-10-25 DIAGNOSIS — I10 Essential (primary) hypertension: Secondary | ICD-10-CM | POA: Diagnosis not present

## 2014-10-25 DIAGNOSIS — Z8673 Personal history of transient ischemic attack (TIA), and cerebral infarction without residual deficits: Secondary | ICD-10-CM | POA: Diagnosis not present

## 2014-11-15 DIAGNOSIS — R1310 Dysphagia, unspecified: Secondary | ICD-10-CM | POA: Diagnosis not present

## 2014-11-15 DIAGNOSIS — Z7902 Long term (current) use of antithrombotics/antiplatelets: Secondary | ICD-10-CM | POA: Diagnosis not present

## 2014-11-15 DIAGNOSIS — Z7982 Long term (current) use of aspirin: Secondary | ICD-10-CM | POA: Diagnosis not present

## 2014-11-23 DIAGNOSIS — R1314 Dysphagia, pharyngoesophageal phase: Secondary | ICD-10-CM | POA: Diagnosis not present

## 2014-11-23 DIAGNOSIS — K222 Esophageal obstruction: Secondary | ICD-10-CM | POA: Diagnosis not present

## 2014-11-23 DIAGNOSIS — K2970 Gastritis, unspecified, without bleeding: Secondary | ICD-10-CM | POA: Diagnosis not present

## 2014-11-23 DIAGNOSIS — K319 Disease of stomach and duodenum, unspecified: Secondary | ICD-10-CM | POA: Diagnosis not present

## 2014-11-23 DIAGNOSIS — K2940 Chronic atrophic gastritis without bleeding: Secondary | ICD-10-CM | POA: Diagnosis not present

## 2014-11-23 DIAGNOSIS — K208 Other esophagitis: Secondary | ICD-10-CM | POA: Diagnosis not present

## 2014-11-23 DIAGNOSIS — K2950 Unspecified chronic gastritis without bleeding: Secondary | ICD-10-CM | POA: Diagnosis not present

## 2014-11-23 IMAGING — RF DG HIP OPERATIVE*L*
1 series · 4 of 4 positions shown · non-contrast
Comparison: Left hip radiographs performed 11/25/2013

CLINICAL DATA: Internal fixation of left acetabular fracture.

EXAM:
OPERATIVE LEFT HIP

[Series 1: run · 4 of 4 slices shown]
[im 1/4]
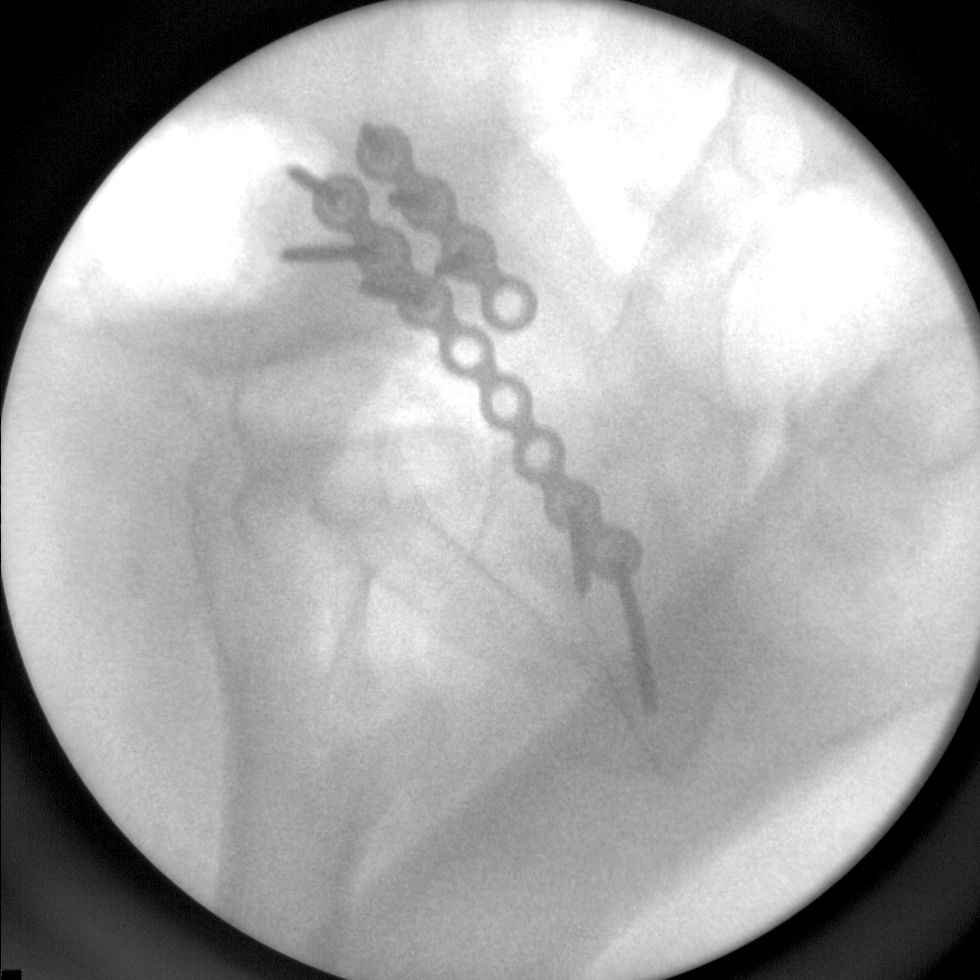
[im 2/4]
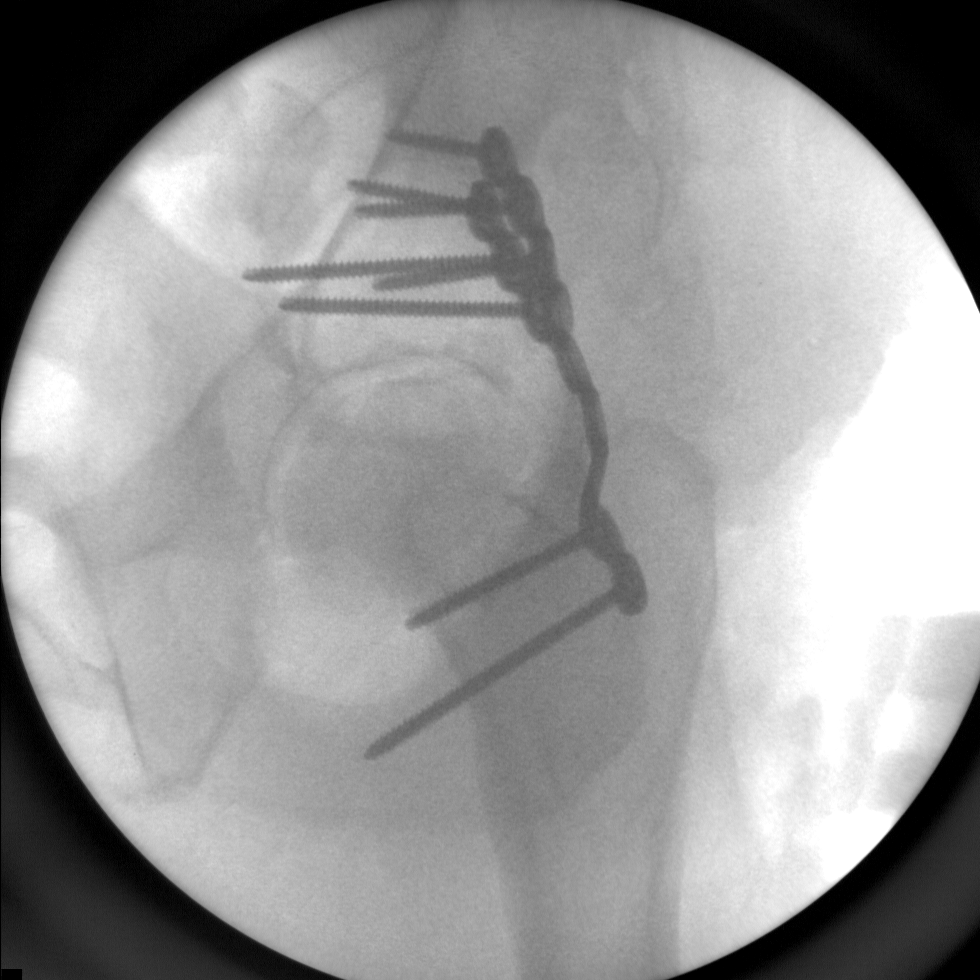
[im 3/4]
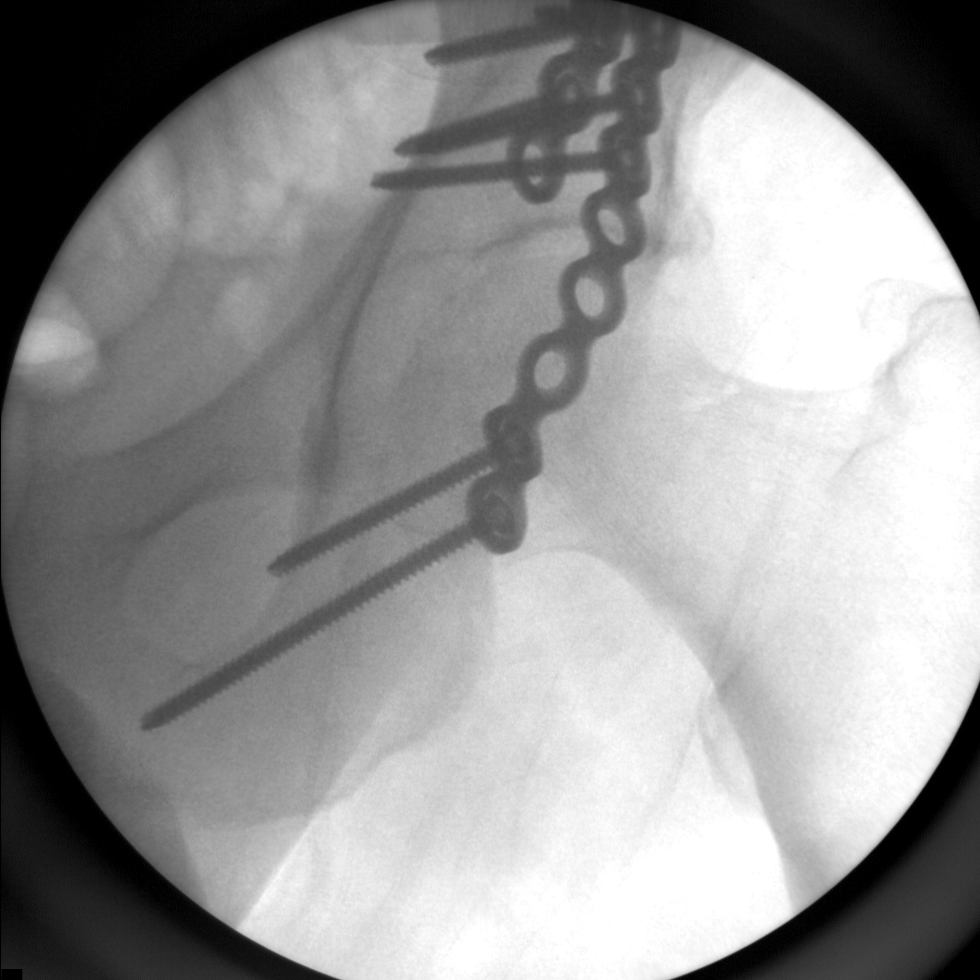
[im 4/4]
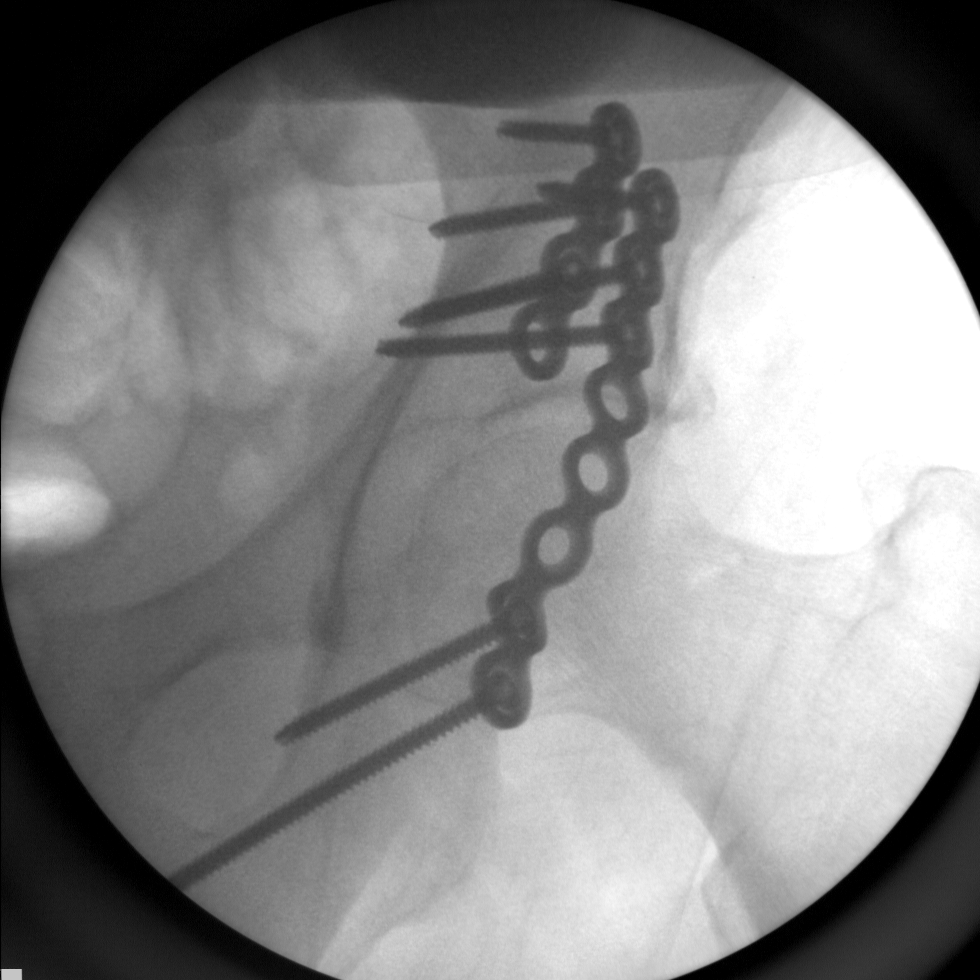

[4 of 4 positions shown; findings below may reference images not displayed]

FINDINGS: Four fluoroscopic C-arm images are provided from the OR. These
demonstrate successful placement of plates and screws transfixing
the patient's acetabular fracture in grossly anatomic alignment. No
new fractures are seen. The left femoral head is seated at the
acetabulum.
IMPRESSION: Interval internal fixation of left acetabular fracture in grossly
anatomic alignment.

## 2014-12-15 DIAGNOSIS — K219 Gastro-esophageal reflux disease without esophagitis: Secondary | ICD-10-CM | POA: Diagnosis not present

## 2014-12-15 DIAGNOSIS — K222 Esophageal obstruction: Secondary | ICD-10-CM | POA: Diagnosis not present

## 2015-02-13 DIAGNOSIS — M5137 Other intervertebral disc degeneration, lumbosacral region: Secondary | ICD-10-CM | POA: Diagnosis not present

## 2015-03-17 DIAGNOSIS — K219 Gastro-esophageal reflux disease without esophagitis: Secondary | ICD-10-CM | POA: Diagnosis not present

## 2015-03-17 DIAGNOSIS — E785 Hyperlipidemia, unspecified: Secondary | ICD-10-CM | POA: Diagnosis not present

## 2015-03-17 DIAGNOSIS — I2510 Atherosclerotic heart disease of native coronary artery without angina pectoris: Secondary | ICD-10-CM | POA: Diagnosis not present

## 2015-03-17 DIAGNOSIS — I10 Essential (primary) hypertension: Secondary | ICD-10-CM | POA: Diagnosis not present

## 2015-03-17 DIAGNOSIS — E119 Type 2 diabetes mellitus without complications: Secondary | ICD-10-CM | POA: Diagnosis not present

## 2015-03-22 DIAGNOSIS — S32462D Displaced associated transverse-posterior fracture of left acetabulum, subsequent encounter for fracture with routine healing: Secondary | ICD-10-CM | POA: Diagnosis not present

## 2015-03-22 DIAGNOSIS — S82142D Displaced bicondylar fracture of left tibia, subsequent encounter for closed fracture with routine healing: Secondary | ICD-10-CM | POA: Diagnosis not present

## 2015-03-22 DIAGNOSIS — M6281 Muscle weakness (generalized): Secondary | ICD-10-CM | POA: Diagnosis not present

## 2015-03-22 DIAGNOSIS — M25561 Pain in right knee: Secondary | ICD-10-CM | POA: Diagnosis not present

## 2015-05-15 DIAGNOSIS — M5137 Other intervertebral disc degeneration, lumbosacral region: Secondary | ICD-10-CM | POA: Diagnosis not present

## 2015-05-15 DIAGNOSIS — M961 Postlaminectomy syndrome, not elsewhere classified: Secondary | ICD-10-CM | POA: Diagnosis not present

## 2015-08-09 DIAGNOSIS — Z5181 Encounter for therapeutic drug level monitoring: Secondary | ICD-10-CM | POA: Diagnosis not present

## 2015-08-09 DIAGNOSIS — M5137 Other intervertebral disc degeneration, lumbosacral region: Secondary | ICD-10-CM | POA: Diagnosis not present

## 2015-08-09 DIAGNOSIS — M961 Postlaminectomy syndrome, not elsewhere classified: Secondary | ICD-10-CM | POA: Diagnosis not present

## 2015-08-11 DIAGNOSIS — Z23 Encounter for immunization: Secondary | ICD-10-CM | POA: Diagnosis not present

## 2015-09-25 DIAGNOSIS — G8929 Other chronic pain: Secondary | ICD-10-CM | POA: Diagnosis not present

## 2015-09-25 DIAGNOSIS — E7800 Pure hypercholesterolemia, unspecified: Secondary | ICD-10-CM | POA: Diagnosis not present

## 2015-09-25 DIAGNOSIS — M5441 Lumbago with sciatica, right side: Secondary | ICD-10-CM | POA: Diagnosis not present

## 2015-09-25 DIAGNOSIS — M5442 Lumbago with sciatica, left side: Secondary | ICD-10-CM | POA: Diagnosis not present

## 2015-10-05 DIAGNOSIS — M5127 Other intervertebral disc displacement, lumbosacral region: Secondary | ICD-10-CM | POA: Diagnosis not present

## 2015-10-11 DIAGNOSIS — Z6830 Body mass index (BMI) 30.0-30.9, adult: Secondary | ICD-10-CM | POA: Diagnosis not present

## 2015-10-11 DIAGNOSIS — F329 Major depressive disorder, single episode, unspecified: Secondary | ICD-10-CM | POA: Diagnosis not present

## 2015-10-11 DIAGNOSIS — I10 Essential (primary) hypertension: Secondary | ICD-10-CM | POA: Diagnosis not present

## 2015-10-11 DIAGNOSIS — F419 Anxiety disorder, unspecified: Secondary | ICD-10-CM | POA: Diagnosis not present

## 2015-11-01 DIAGNOSIS — M5442 Lumbago with sciatica, left side: Secondary | ICD-10-CM | POA: Diagnosis not present

## 2015-11-01 DIAGNOSIS — M5416 Radiculopathy, lumbar region: Secondary | ICD-10-CM | POA: Diagnosis not present

## 2015-11-01 DIAGNOSIS — M5126 Other intervertebral disc displacement, lumbar region: Secondary | ICD-10-CM | POA: Diagnosis not present

## 2015-11-01 DIAGNOSIS — G8929 Other chronic pain: Secondary | ICD-10-CM | POA: Diagnosis not present

## 2015-11-01 DIAGNOSIS — M5441 Lumbago with sciatica, right side: Secondary | ICD-10-CM | POA: Diagnosis not present

## 2015-11-13 DIAGNOSIS — E1151 Type 2 diabetes mellitus with diabetic peripheral angiopathy without gangrene: Secondary | ICD-10-CM | POA: Diagnosis not present

## 2015-11-22 ENCOUNTER — Observation Stay (HOSPITAL_COMMUNITY)
Admission: EM | Admit: 2015-11-22 | Discharge: 2015-11-24 | Disposition: A | Discharge status: Home / Self Care | Payer: Medicare Other | Attending: Internal Medicine | Admitting: Internal Medicine

## 2015-11-22 ENCOUNTER — Emergency Department (HOSPITAL_COMMUNITY): Payer: Medicare Other

## 2015-11-22 ENCOUNTER — Encounter (HOSPITAL_COMMUNITY): Payer: Self-pay | Admitting: Emergency Medicine

## 2015-11-22 DIAGNOSIS — R079 Chest pain, unspecified: Secondary | ICD-10-CM | POA: Diagnosis not present

## 2015-11-22 DIAGNOSIS — Z8673 Personal history of transient ischemic attack (TIA), and cerebral infarction without residual deficits: Secondary | ICD-10-CM | POA: Diagnosis not present

## 2015-11-22 DIAGNOSIS — K219 Gastro-esophageal reflux disease without esophagitis: Secondary | ICD-10-CM | POA: Diagnosis not present

## 2015-11-22 DIAGNOSIS — I517 Cardiomegaly: Secondary | ICD-10-CM | POA: Diagnosis present

## 2015-11-22 DIAGNOSIS — E119 Type 2 diabetes mellitus without complications: Secondary | ICD-10-CM | POA: Diagnosis not present

## 2015-11-22 DIAGNOSIS — I2510 Atherosclerotic heart disease of native coronary artery without angina pectoris: Secondary | ICD-10-CM | POA: Diagnosis not present

## 2015-11-22 DIAGNOSIS — Z7902 Long term (current) use of antithrombotics/antiplatelets: Secondary | ICD-10-CM | POA: Insufficient documentation

## 2015-11-22 DIAGNOSIS — Z79899 Other long term (current) drug therapy: Secondary | ICD-10-CM | POA: Insufficient documentation

## 2015-11-22 DIAGNOSIS — R0600 Dyspnea, unspecified: Secondary | ICD-10-CM | POA: Diagnosis present

## 2015-11-22 DIAGNOSIS — Z7901 Long term (current) use of anticoagulants: Secondary | ICD-10-CM | POA: Diagnosis not present

## 2015-11-22 DIAGNOSIS — E7800 Pure hypercholesterolemia, unspecified: Secondary | ICD-10-CM | POA: Insufficient documentation

## 2015-11-22 DIAGNOSIS — I119 Hypertensive heart disease without heart failure: Secondary | ICD-10-CM | POA: Insufficient documentation

## 2015-11-22 DIAGNOSIS — R0602 Shortness of breath: Secondary | ICD-10-CM | POA: Diagnosis not present

## 2015-11-22 DIAGNOSIS — F17210 Nicotine dependence, cigarettes, uncomplicated: Secondary | ICD-10-CM | POA: Diagnosis not present

## 2015-11-22 DIAGNOSIS — Z951 Presence of aortocoronary bypass graft: Secondary | ICD-10-CM | POA: Insufficient documentation

## 2015-11-22 DIAGNOSIS — R072 Precordial pain: Secondary | ICD-10-CM | POA: Diagnosis not present

## 2015-11-22 DIAGNOSIS — Z720 Tobacco use: Secondary | ICD-10-CM | POA: Diagnosis present

## 2015-11-22 DIAGNOSIS — Z7984 Long term (current) use of oral hypoglycemic drugs: Secondary | ICD-10-CM | POA: Insufficient documentation

## 2015-11-22 DIAGNOSIS — D72829 Elevated white blood cell count, unspecified: Secondary | ICD-10-CM | POA: Diagnosis not present

## 2015-11-22 DIAGNOSIS — Z7982 Long term (current) use of aspirin: Secondary | ICD-10-CM | POA: Insufficient documentation

## 2015-11-22 DIAGNOSIS — I10 Essential (primary) hypertension: Secondary | ICD-10-CM | POA: Diagnosis present

## 2015-11-22 LAB — CBC
HEMATOCRIT: 42.3 % (ref 39.0–52.0)
Hemoglobin: 14.2 g/dL (ref 13.0–17.0)
MCH: 29.6 pg (ref 26.0–34.0)
MCHC: 33.6 g/dL (ref 30.0–36.0)
MCV: 88.1 fL (ref 78.0–100.0)
PLATELETS: 215 10*3/uL (ref 150–400)
RBC: 4.8 MIL/uL (ref 4.22–5.81)
RDW: 13.7 % (ref 11.5–15.5)
WBC: 13 10*3/uL — ABNORMAL HIGH (ref 4.0–10.5)

## 2015-11-22 LAB — BRAIN NATRIURETIC PEPTIDE: B Natriuretic Peptide: 85.9 pg/mL (ref 0.0–100.0)

## 2015-11-22 LAB — I-STAT TROPONIN, ED: Troponin i, poc: 0.01 ng/mL (ref 0.00–0.08)

## 2015-11-22 LAB — BASIC METABOLIC PANEL
Anion gap: 11 (ref 5–15)
BUN: 6 mg/dL (ref 6–20)
CO2: 25 mmol/L (ref 22–32)
CREATININE: 0.86 mg/dL (ref 0.61–1.24)
Calcium: 9 mg/dL (ref 8.9–10.3)
Chloride: 100 mmol/L — ABNORMAL LOW (ref 101–111)
GFR calc Af Amer: >60 mL/min (ref >60)
GFR calc non Af Amer: >60 mL/min (ref >60)
GLUCOSE: 147 mg/dL — AB (ref 65–99)
POTASSIUM: 4.6 mmol/L (ref 3.5–5.1)
Sodium: 136 mmol/L (ref 135–145)

## 2015-11-22 LAB — PROTIME-INR
INR: 1.08 (ref 0.00–1.49)
Prothrombin Time: 14.2 seconds (ref 11.6–15.2)

## 2015-11-22 MED ORDER — ENALAPRIL MALEATE 20 MG PO TABS
20.0000 mg | ORAL_TABLET | Freq: Every day | ORAL | Status: DC
  Administered 2015-11-23 – 2015-11-24 (×2): 20 mg via ORAL
  Filled 2015-11-22: qty 1
  Filled 2015-11-22 (×2): qty 2
  Filled 2015-11-22: qty 1

## 2015-11-22 MED ORDER — CLOPIDOGREL BISULFATE 75 MG PO TABS
75.0000 mg | ORAL_TABLET | Freq: Every day | ORAL | Status: DC
  Administered 2015-11-23 – 2015-11-24 (×2): 75 mg via ORAL
  Filled 2015-11-22 (×3): qty 1

## 2015-11-22 MED ORDER — METOPROLOL TARTRATE 50 MG PO TABS
50.0000 mg | ORAL_TABLET | Freq: Two times a day (BID) | ORAL | Status: DC
  Administered 2015-11-22 – 2015-11-23 (×2): 50 mg via ORAL
  Filled 2015-11-22: qty 1
  Filled 2015-11-22: qty 2

## 2015-11-22 MED ORDER — ATORVASTATIN CALCIUM 80 MG PO TABS: 80.0000 mg | ORAL_TABLET | Freq: Every day | ORAL | Status: DC

## 2015-11-22 MED ORDER — GI COCKTAIL ~~LOC~~
30.0000 mL | Freq: Once | ORAL | Status: AC
  Administered 2015-11-22: 30 mL via ORAL
  Filled 2015-11-22: qty 30

## 2015-11-22 MED ORDER — ASPIRIN 81 MG PO CHEW
81.0000 mg | CHEWABLE_TABLET | Freq: Every day | ORAL | Status: DC
  Administered 2015-11-23 – 2015-11-24 (×2): 81 mg via ORAL
  Filled 2015-11-22 (×2): qty 1

## 2015-11-22 MED ORDER — DILTIAZEM HCL 60 MG PO TABS
120.0000 mg | ORAL_TABLET | Freq: Every day | ORAL | Status: DC
  Administered 2015-11-23: 120 mg via ORAL
  Filled 2015-11-22: qty 2
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
## 2015-11-22 NOTE — ED Notes (Signed)
Attempted report X1

## 2015-11-22 NOTE — ED Notes (Signed)
Pt here with SOB since yesterday morning and CP starting approx 1700 while at rest. Pt received 324 ASA and 3 nitro PTA with relief. Pt is pain free at this time.

## 2015-11-22 NOTE — ED Notes (Addendum)
Attempted report X2. Floor nurse did call for report. Occupied with pt.
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
## 2015-11-23 ENCOUNTER — Observation Stay (HOSPITAL_BASED_OUTPATIENT_CLINIC_OR_DEPARTMENT_OTHER): Payer: Medicare Other

## 2015-11-23 DIAGNOSIS — Z720 Tobacco use: Secondary | ICD-10-CM | POA: Diagnosis not present

## 2015-11-23 DIAGNOSIS — E119 Type 2 diabetes mellitus without complications: Secondary | ICD-10-CM | POA: Diagnosis not present

## 2015-11-23 DIAGNOSIS — R0600 Dyspnea, unspecified: Secondary | ICD-10-CM

## 2015-11-23 DIAGNOSIS — R079 Chest pain, unspecified: Secondary | ICD-10-CM | POA: Diagnosis not present

## 2015-11-23 DIAGNOSIS — I119 Hypertensive heart disease without heart failure: Secondary | ICD-10-CM | POA: Insufficient documentation

## 2015-11-23 DIAGNOSIS — I2510 Atherosclerotic heart disease of native coronary artery without angina pectoris: Secondary | ICD-10-CM | POA: Diagnosis not present

## 2015-11-23 LAB — ECHOCARDIOGRAM COMPLETE
HEIGHTINCHES: 69 in
Weight: 3424 oz

## 2015-11-23 LAB — URINALYSIS, ROUTINE W REFLEX MICROSCOPIC
BILIRUBIN URINE: NEGATIVE
GLUCOSE, UA: NEGATIVE mg/dL
HGB URINE DIPSTICK: NEGATIVE
Ketones, ur: NEGATIVE mg/dL
Leukocytes, UA: NEGATIVE
Nitrite: NEGATIVE
PH: 7 (ref 5.0–8.0)
Protein, ur: NEGATIVE mg/dL
SPECIFIC GRAVITY, URINE: 1.012 (ref 1.005–1.030)

## 2015-11-23 LAB — TROPONIN I

## 2015-11-23 LAB — RAPID URINE DRUG SCREEN, HOSP PERFORMED
AMPHETAMINES: NOT DETECTED
BARBITURATES: NOT DETECTED
Benzodiazepines: POSITIVE — AB
Cocaine: NOT DETECTED
OPIATES: NOT DETECTED
TETRAHYDROCANNABINOL: NOT DETECTED

## 2015-11-23 LAB — GLUCOSE, CAPILLARY
GLUCOSE-CAPILLARY: 115 mg/dL — AB (ref 65–99)
GLUCOSE-CAPILLARY: 176 mg/dL — AB (ref 65–99)
Glucose-Capillary: 118 mg/dL — ABNORMAL HIGH (ref 65–99)
Glucose-Capillary: 120 mg/dL — ABNORMAL HIGH (ref 65–99)
Glucose-Capillary: 85 mg/dL (ref 65–99)

## 2015-11-23 MED ORDER — MORPHINE SULFATE (PF) 2 MG/ML IV SOLN: 2.0000 mg | INTRAVENOUS | Status: DC | PRN

## 2015-11-23 MED ORDER — ATORVASTATIN CALCIUM 80 MG PO TABS
80.0000 mg | ORAL_TABLET | Freq: Every day | ORAL | Status: DC
  Administered 2015-11-23: 80 mg via ORAL
  Filled 2015-11-23: qty 1

## 2015-11-23 MED ORDER — DIAZEPAM 5 MG PO TABS
5.0000 mg | ORAL_TABLET | Freq: Three times a day (TID) | ORAL | Status: DC
  Administered 2015-11-23 – 2015-11-24 (×4): 5 mg via ORAL
  Filled 2015-11-23 (×4): qty 1

## 2015-11-23 MED ORDER — ACETAMINOPHEN 325 MG PO TABS
650.0000 mg | ORAL_TABLET | ORAL | Status: DC | PRN
  Administered 2015-11-24: 650 mg via ORAL
  Filled 2015-11-23 (×2): qty 2

## 2015-11-23 MED ORDER — PANTOPRAZOLE SODIUM 40 MG PO TBEC
40.0000 mg | DELAYED_RELEASE_TABLET | Freq: Every day | ORAL | Status: DC
  Administered 2015-11-23 – 2015-11-24 (×2): 40 mg via ORAL
  Filled 2015-11-23 (×2): qty 1

## 2015-11-23 MED ORDER — ENOXAPARIN SODIUM 40 MG/0.4ML ~~LOC~~ SOLN
40.0000 mg | Freq: Every day | SUBCUTANEOUS | Status: DC
  Administered 2015-11-23 (×2): 40 mg via SUBCUTANEOUS
  Filled 2015-11-23 (×2): qty 0.4

## 2015-11-23 MED ORDER — OXYCODONE HCL 5 MG PO TABS
5.0000 mg | ORAL_TABLET | Freq: Four times a day (QID) | ORAL | Status: DC | PRN
  Administered 2015-11-23 – 2015-11-24 (×4): 5 mg via ORAL
  Filled 2015-11-23 (×5): qty 1

## 2015-11-23 MED ORDER — INSULIN ASPART 100 UNIT/ML ~~LOC~~ SOLN: 0.0000 [IU] | Freq: Every day | SUBCUTANEOUS | Status: DC

## 2015-11-23 MED ORDER — CARVEDILOL 25 MG PO TABS
25.0000 mg | ORAL_TABLET | Freq: Two times a day (BID) | ORAL | Status: DC
  Administered 2015-11-23 – 2015-11-24 (×2): 25 mg via ORAL
  Filled 2015-11-23 (×2): qty 1

## 2015-11-23 MED ORDER — CARVEDILOL 25 MG PO TABS: 25.0000 mg | ORAL_TABLET | Freq: Two times a day (BID) | ORAL | Status: AC

## 2015-11-23 MED ORDER — INSULIN ASPART 100 UNIT/ML ~~LOC~~ SOLN
0.0000 [IU] | Freq: Three times a day (TID) | SUBCUTANEOUS | Status: DC
  Administered 2015-11-23 – 2015-11-24 (×2): 2 [IU] via SUBCUTANEOUS

## 2015-11-23 MED ORDER — TAMSULOSIN HCL 0.4 MG PO CAPS
0.4000 mg | ORAL_CAPSULE | Freq: Every day | ORAL | Status: DC
  Administered 2015-11-23 – 2015-11-24 (×2): 0.4 mg via ORAL
  Filled 2015-11-23 (×2): qty 1

## 2015-11-23 MED ORDER — ONDANSETRON HCL 4 MG/2ML IJ SOLN: 4.0000 mg | Freq: Four times a day (QID) | INTRAMUSCULAR | Status: DC | PRN

## 2015-11-23 MED ORDER — DILTIAZEM HCL ER COATED BEADS 120 MG PO CP24: 120.0000 mg | ORAL_CAPSULE | Freq: Every day | ORAL | Status: AC

## 2015-11-23 MED ORDER — DILTIAZEM HCL ER COATED BEADS 120 MG PO CP24
120.0000 mg | ORAL_CAPSULE | Freq: Every day | ORAL | Status: DC
  Administered 2015-11-24: 120 mg via ORAL
  Filled 2015-11-23: qty 1

## 2015-11-23 NOTE — Progress Notes (Signed)
  Echocardiogram 2D Echocardiogram has been performed.  Arvil ChacoFoster, Orlan Aversa 11/23/2015, 4:05 PM

## 2015-11-23 NOTE — Progress Notes (Signed)
PROGRESS NOTE  [name] ZOX:[id number] DOB: 01-31-1951 DOA: 11/22/2015 PCP: No PCP Per Patient Outpatient Specialists:      Brief Narrative: 65 y.o. male with medical history significant of hypertension, diabetes and hyperlipidemia admitted with chest pain  Assessment & Plan: Active Problems:   Hypertension   Diabetes mellitus without complication (HCC)   Chest pain   CAD (coronary artery disease)   Cardiomegaly   Leukocytosis   Tobacco abuse   Dyspnea   Hypertensive heart disease without heart failure    Chest pain  - resolved, CE negative. Cardiology consulted and recommending 2D echo  CAD (coronary artery disease) continue statin aspirin beta blocker   Hypertension elevated will treat with Coreg, Diltiazem  Diabetes mellitus - hold metformin order sliding scale  Dyspnea - cardiac vs pulmonary cause will obtain echo, rec stop smoking, albuterol PRN, PFT's as out patient  tobacco abuse - rec. Stop smoking nicotine patch  DVT prophylaxis: Lovenox Code Status: Full Family Communication: no family at bedside Disposition Plan: home when ready  Consultants:   Cardiology  Procedures:   2D echo: pending  Antimicrobials:  none  Subjective: No chest pain  Objective: Filed Vitals:   11/23/15 0509 11/23/15 0730 11/23/15 1304 11/23/15 1310  BP: 148/67 163/67 151/56 151/56  Pulse: 55 66 52 52  Temp: 98.2 F (36.8 C)  97.7 F (36.5 C) 97.7 F (36.5 C)  TempSrc: Oral  Oral Oral  Resp: 18  18 18   Height:      Weight:      SpO2: 98%  98% 98%    Intake/Output Summary (Last 24 hours) at 11/23/15 1741 Last data filed at 11/23/15 1300  Gross per 24 hour  Intake      0 ml  Output    350 ml  Net   -350 ml   Filed Weights   11/22/15 1829  Weight: 97.07 kg (214 lb)    Examination: Constitutional: NAD, calm, comfortable Filed Vitals:   11/23/15 0509 11/23/15 0730 11/23/15 1304 11/23/15 1310  BP: 148/67 163/67 151/56 151/56  Pulse: 55 66 52  52  Temp: 98.2 F (36.8 C)  97.7 F (36.5 C) 97.7 F (36.5 C)  TempSrc: Oral  Oral Oral  Resp: 18  18 18   Height:      Weight:      SpO2: 98%  98% 98%   Respiratory: clear to auscultation bilaterally, no wheezing, no crackles. Normal respiratory effort. No accessory muscle use.  Cardiovascular: Regular rate and rhythm, no murmurs / rubs / gallops. No extremity edema. 2+ pedal pulses. No carotid bruits.  Abdomen: no tenderness   Data Reviewed: I have personally reviewed following labs and imaging studies  CBC:  Recent Labs Lab 11/22/15 1844  WBC 13.0*  HGB 14.2  HCT 42.3  MCV 88.1  PLT 215   Basic Metabolic Panel:  Recent Labs Lab 11/22/15 1844  NA 136  K 4.6  CL 100*  CO2 25  GLUCOSE 147*  BUN 6  CREATININE 0.86  CALCIUM 9.0   GFR: Estimated Creatinine Clearance: 98.5 mL/min (by C-G formula based on Cr of 0.86). Liver Function Tests: No results for input(s): AST, ALT, ALKPHOS, BILITOT, PROT, ALBUMIN in the last 168 hours. No results for input(s): LIPASE, AMYLASE in the last 168 hours. No results for input(s): AMMONIA in the last 168 hours. Coagulation Profile:  Recent Labs Lab 11/22/15 1844  INR 1.08   Cardiac Enzymes:  Recent Labs Lab 11/23/15 0028 11/23/15 0259 11/23/15 [id number]  TROPONINI <0.03 <0.03 <0.03   BNP (last 3 results) No results for input(s): PROBNP in the last 8760 hours. HbA1C: No results for input(s): HGBA1C in the last 72 hours. CBG:  Recent Labs Lab 11/23/15 0211 11/23/15 0620 11/23/15 1128 11/23/15 1605  GLUCAP 118* 115* 120* 176*   Lipid Profile: No results for input(s): CHOL, HDL, LDLCALC, TRIG, CHOLHDL, LDLDIRECT in the last 72 hours. Thyroid Function Tests: No results for input(s): TSH, T4TOTAL, FREET4, T3FREE, THYROIDAB in the last 72 hours. Anemia Panel: No results for input(s): VITAMINB12, FOLATE, FERRITIN, TIBC, IRON, RETICCTPCT in the last 72 hours. Urine analysis:    Component Value Date/Time    COLORURINE YELLOW 11/23/2015 0523   APPEARANCEUR CLEAR 11/23/2015 0523   LABSPEC 1.012 11/23/2015 0523   PHURINE 7.0 11/23/2015 0523   GLUCOSEU NEGATIVE 11/23/2015 0523   HGBUR NEGATIVE 11/23/2015 0523   BILIRUBINUR NEGATIVE 11/23/2015 0523   KETONESUR NEGATIVE 11/23/2015 0523   PROTEINUR NEGATIVE 11/23/2015 0523   UROBILINOGEN 1.0 11/28/2013 1050   NITRITE NEGATIVE 11/23/2015 0523   LEUKOCYTESUR NEGATIVE 11/23/2015 0523   Sepsis Labs: Invalid input(s): PROCALCITONIN, LACTICIDVEN  No results found for this or any previous visit (from the past 240 hour(s)).    Radiology Studies: Dg Chest 2 View  11/22/2015  CLINICAL DATA:  Hypertension. Dizziness. Shortness of breath, 3-4 days duration. EXAM: CHEST  2 VIEW COMPARISON:  10/25/2014 FINDINGS: Previous median sternotomy and CABG. The heart is mildly enlarged. Mediastinal shadows are otherwise normal. The vascularity is normal. The lungs are clear. No effusions. Prominent epicardial fat pad. No abnormal bone finding. IMPRESSION: Previous CABG.  Mild cardiomegaly.  No active disease. Electronically Signed   By: Paulina Fusi M.D.   On: 11/22/2015 19:38     Scheduled Meds: . aspirin  81 mg Oral Daily  . atorvastatin  80 mg Oral q1800  . carvedilol  25 mg Oral BID WC  . clopidogrel  75 mg Oral Daily  . diazepam  5 mg Oral TID  . diltiazem  120 mg Oral Daily  . enalapril  20 mg Oral Daily  . enoxaparin (LOVENOX) injection  40 mg Subcutaneous QHS  . insulin aspart  0-5 Units Subcutaneous QHS  . insulin aspart  0-9 Units Subcutaneous TID WC  . pantoprazole  40 mg Oral Daily  . tamsulosin  0.4 mg Oral Daily   Continuous Infusions:   Pamella Pert, MD, PhD [facility] Pager [phone]  If 7PM-7AM, please contact night-coverage [email or web address] Password [facility] 11/23/2015, 5:41 PM
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
## 2015-11-23 NOTE — Care Management Obs Status (Signed)
MEDICARE OBSERVATION STATUS NOTIFICATION   Patient Details  Name: [name] MRN: [id number] Date of Birth: [birth year]   Medicare Observation Status Notification Given:  Yes    Gala LewandowskyGraves-Bigelow, Guillermina Shaft Kaye, RN 11/23/2015, 10:37 AM

## 2015-11-24 ENCOUNTER — Other Ambulatory Visit (HOSPITAL_COMMUNITY): Payer: Medicare Other

## 2015-11-24 DIAGNOSIS — I119 Hypertensive heart disease without heart failure: Secondary | ICD-10-CM | POA: Diagnosis not present

## 2015-11-24 DIAGNOSIS — I2510 Atherosclerotic heart disease of native coronary artery without angina pectoris: Secondary | ICD-10-CM | POA: Diagnosis not present

## 2015-11-24 DIAGNOSIS — R079 Chest pain, unspecified: Secondary | ICD-10-CM | POA: Diagnosis not present

## 2015-11-24 DIAGNOSIS — E119 Type 2 diabetes mellitus without complications: Secondary | ICD-10-CM | POA: Diagnosis not present

## 2015-11-24 LAB — LIPID PANEL
Cholesterol: 167 mg/dL (ref 0–200)
HDL: 37 mg/dL — AB (ref >40)
LDL CALC: 103 mg/dL — AB (ref 0–99)
TRIGLYCERIDES: 137 mg/dL (ref <150)
Total CHOL/HDL Ratio: 4.5 RATIO
VLDL: 27 mg/dL (ref 0–40)

## 2015-11-24 LAB — GLUCOSE, CAPILLARY
Glucose-Capillary: 116 mg/dL — ABNORMAL HIGH (ref 65–99)
Glucose-Capillary: 164 mg/dL — ABNORMAL HIGH (ref 65–99)

## 2015-11-24 LAB — URINE CULTURE

## 2015-11-24 LAB — HEMOGLOBIN A1C
Hgb A1c MFr Bld: 6.7 % — ABNORMAL HIGH (ref 4.8–5.6)
Mean Plasma Glucose: 146 mg/dL
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
## 2015-11-24 NOTE — Discharge Summary (Signed)
Physician Discharge Summary  [name]:[id number] DOB: Jul 10, 1951 DOA: 11/22/2015  PCP: No PCP Per Patient  Admit date: 11/22/2015 Discharge date: 11/24/2015  Time spent: > 30 minutes  Recommendations for Outpatient Follow-up:  1. Follow up with Cardiology as an outpatient, appointment has been scheduled as below  Discharge Diagnoses:  Active Problems:   Hypertension   Diabetes mellitus without complication (HCC)   Chest pain   CAD (coronary artery disease)   Cardiomegaly   Leukocytosis   Tobacco abuse   Dyspnea   Hypertensive heart disease without heart failure  Discharge Condition: stable  Diet recommendation: heart healthy  Filed Weights   11/22/15 1829  Weight: 97.07 kg (214 lb)   History of present illness:  See H&P, Labs, Consult and Test reports for all details in brief, patient is a 65 y.o. male with medical history significant of hypertension, diabetes and hyperlipidemia admitted with chest pain  Hospital Course:   Chest pain - patient was admitted the hospital with chest pain. Cardiology was consulted and have followed patient while hospitalized, recommending a 2D echo and if negative patient can be discharged with outpatient follow up. His cardiac enzymes have remained negative and his chest pain has resolved. He had a 2D echo which showed EF 60-65%. I discussed with cardiology, he will have follow up with his cardiologist in 2 weeks and is stable for discharge.  CAD (coronary artery disease) continue statin aspirin beta blocker  Hypertension - his home medications were changed as below, was placed on Coreg and long acting cardizem. Will need outpatient follow up for further titrations. Diabetes mellitus - resume home medications Dyspnea - rec stop smoking, albuterol PRN, PFT's as out patient Tobacco abuse - counseled for cessation   Procedures:  2D echo Study Conclusions - Left ventricle: The cavity size was normal. There was mild focal  basal hypertrophy of the septum. Systolic function was normal.  The estimated ejection fraction was in the range of 60% to 65% Wall motion was normal; there were no regional wall motion  abnormalities. Doppler parameters are consistent with abnormal left ventricular relaxation (grade 1 diastolic dysfunction).  There was no evidence of elevated ventricular filling pressure by Doppler parameters. - Aortic valve: Trileaflet; mildly thickened, mildly calcified leaflets. There was no regurgitation. - Aortic root: The aortic root was normal in size. - Mitral valve: Structurally normal valve. There was no regurgitation. - Left atrium: The atrium was mildly dilated. - Right ventricle: Systolic function was mildly reduced. - Right atrium: The atrium was normal in size. - Tricuspid valve: There was trivial regurgitation. - Pulmonic valve: There was no regurgitation. - Inferior vena cava: The vessel was normal in size. - Pericardium, extracardiac: A trivial pericardial effusion was identified posterior to the heart. Features were not consistent with tamponade physiology.   Consultations:  Cardiology   Discharge Exam: Filed Vitals:   11/24/15 0329 11/24/15 0636 11/24/15 0853 11/24/15 1100  BP: 119/59 113/73 161/59 179/67  Pulse: 59 65 56 56  Temp: 97.8 F (36.6 C)  98.7 F (37.1 C)   TempSrc: Oral  Oral   Resp: 18     Height:      Weight:      SpO2: 93%  95% 94%    General: NAD Cardiovascular: RRR Respiratory: CTA biL  Discharge Instructions Activity:  As tolerated   Get Medicines reviewed and adjusted: Please take all your medications with you for your next visit with your Primary MD  Please request your  Primary MD to go over all hospital tests and procedure/radiological results at the follow up, please ask your Primary MD to get all Hospital records sent to his/her office.  If you experience worsening of your admission symptoms, develop shortness of breath, life threatening  emergency, suicidal or homicidal thoughts you must seek medical attention immediately by calling 911 or calling your MD immediately if symptoms less severe.  You must read complete instructions/literature along with all the possible adverse reactions/side effects for all the Medicines you take and that have been prescribed to you. Take any new Medicines after you have completely understood and accpet all the possible adverse reactions/side effects.   Do not drive when taking Pain medications.   Do not take more than prescribed Pain, Sleep and Anxiety Medications  Special Instructions: If you have smoked or chewed Tobacco in the last 2 yrs please stop smoking, stop any regular Alcohol and or any Recreational drug use.  Wear Seat belts while driving.  Please note  You were cared for by a hospitalist during your hospital stay. Once you are discharged, your primary care physician will handle any further medical issues. Please note that NO REFILLS for any discharge medications will be authorized once you are discharged, as it is imperative that you return to your primary care physician (or establish a relationship with a primary care physician if you do not have one) for your aftercare needs so that they can reassess your need for medications and monitor your lab values.    Medication List    STOP taking these medications        diltiazem 120 MG tablet  Commonly known as:  CARDIZEM     enoxaparin 30 MG/0.3ML injection  Commonly known as:  LOVENOX     metoprolol 50 MG tablet  Commonly known as:  LOPRESSOR     warfarin 5 MG tablet  Commonly known as:  COUMADIN      TAKE these medications        aspirin 81 MG chewable tablet  Chew 81 mg by mouth daily.     atorvastatin 80 MG tablet  Commonly known as:  LIPITOR  Take 80 mg by mouth daily.     carvedilol 25 MG tablet  Commonly known as:  COREG  Take 1 tablet (25 mg total) by mouth 2 (two) times daily with a meal.      clopidogrel 75 MG tablet  Commonly known as:  PLAVIX  Take 75 mg by mouth daily.     diazepam 5 MG tablet  Commonly known as:  VALIUM  Take 5 mg by mouth 3 (three) times daily.     diltiazem 120 MG 24 hr capsule  Commonly known as:  CARDIZEM CD  Take 1 capsule (120 mg total) by mouth daily.     enalapril 20 MG tablet  Commonly known as:  VASOTEC  Take 20 mg by mouth daily.     furosemide 20 MG tablet  Commonly known as:  LASIX  Take 20 mg by mouth daily.     metFORMIN 500 MG tablet  Commonly known as:  GLUCOPHAGE  Take 500 mg by mouth daily.     nitroGLYCERIN 0.4 MG SL tablet  Commonly known as:  NITROSTAT  Place 0.4 mg under the tongue every 5 (five) minutes as needed for chest pain.     omeprazole 20 MG capsule  Commonly known as:  PRILOSEC  Take 20 mg by mouth daily.     oxycodone  5 MG capsule  Commonly known as:  OXY-IR  Take 5 mg by mouth every 8 (eight) hours as needed.     tamsulosin 0.4 MG Caps capsule  Commonly known as:  FLOMAX  Take 0.4 mg by mouth daily.           Follow-up Information    Follow up with Dr. Normajean Baxteravankar. Go on 12/08/2015.   Why:  @ 11am for hosptial f/u    Contact information:   [address], [place], [place] [address]      The results of significant diagnostics from this hospitalization (including imaging, microbiology, ancillary and laboratory) are listed below for reference.    Significant Diagnostic Studies: Dg Chest 2 View  11/22/2015  CLINICAL DATA:  Hypertension. Dizziness. Shortness of breath, 3-4 days duration. EXAM: CHEST  2 VIEW COMPARISON:  10/25/2014 FINDINGS: Previous median sternotomy and CABG. The heart is mildly enlarged. Mediastinal shadows are otherwise normal. The vascularity is normal. The lungs are clear. No effusions. Prominent epicardial fat pad. No abnormal bone finding. IMPRESSION: Previous CABG.  Mild cardiomegaly.  No active disease. Electronically Signed   By: Paulina FusiMark  Shogry M.D.   On: 11/22/2015 19:38     Microbiology: Recent Results (from the past 240 hour(s))  Urine culture     Status: Abnormal   Collection Time: 11/23/15  5:23 AM  Result Value Ref Range Status   Specimen Description URINE, CLEAN CATCH  Final   Special Requests NONE  Final   Culture 3,000 COLONIES/mL INSIGNIFICANT GROWTH (A)  Final   Report Status 11/24/2015 FINAL  Final    Labs: Basic Metabolic Panel:  Recent Labs Lab 11/22/15 1844  NA 136  K 4.6  CL 100*  CO2 25  GLUCOSE 147*  BUN 6  CREATININE 0.86  CALCIUM 9.0   CBC:  Recent Labs Lab 11/22/15 1844  WBC 13.0*  HGB 14.2  HCT 42.3  MCV 88.1  PLT 215   Cardiac Enzymes:  Recent Labs Lab 11/23/15 0028 11/23/15 0259 11/23/15 0607  TROPONINI <0.03 <0.03 <0.03   BNP: BNP (last 3 results)  Recent Labs  11/22/15 1844  BNP 85.9   CBG:  Recent Labs Lab 11/23/15 1128 11/23/15 1605 11/23/15 2156 11/24/15 0622 11/24/15 1144  GLUCAP 120* 176* 85 116* 164*    Signed:  Aryan Sparks  [facility] 11/24/2015, 2:59 PM

## 2015-12-29 DIAGNOSIS — M5126 Other intervertebral disc displacement, lumbar region: Secondary | ICD-10-CM | POA: Diagnosis not present

## 2015-12-29 DIAGNOSIS — M5416 Radiculopathy, lumbar region: Secondary | ICD-10-CM | POA: Diagnosis not present

## 2015-12-29 DIAGNOSIS — M5442 Lumbago with sciatica, left side: Secondary | ICD-10-CM | POA: Diagnosis not present

## 2015-12-29 DIAGNOSIS — M5441 Lumbago with sciatica, right side: Secondary | ICD-10-CM | POA: Diagnosis not present

## 2015-12-29 DIAGNOSIS — G8929 Other chronic pain: Secondary | ICD-10-CM | POA: Diagnosis not present

## 2016-01-17 DIAGNOSIS — Z8781 Personal history of (healed) traumatic fracture: Secondary | ICD-10-CM | POA: Diagnosis not present

## 2016-01-17 DIAGNOSIS — E119 Type 2 diabetes mellitus without complications: Secondary | ICD-10-CM | POA: Diagnosis not present

## 2016-01-17 DIAGNOSIS — N400 Enlarged prostate without lower urinary tract symptoms: Secondary | ICD-10-CM | POA: Diagnosis not present

## 2016-01-17 DIAGNOSIS — I10 Essential (primary) hypertension: Secondary | ICD-10-CM | POA: Diagnosis not present

## 2016-01-17 DIAGNOSIS — E785 Hyperlipidemia, unspecified: Secondary | ICD-10-CM | POA: Diagnosis not present
